# Patient Record
Sex: Female | Born: 1995 | Race: White | Hispanic: No | Marital: Single | Smoking: Never smoker
Health system: Southern US, Community
[De-identification: ages and names within clinical notes are randomized; demographics above are authoritative.]

## PROBLEM LIST (undated history)

## (undated) DIAGNOSIS — F988 Other specified behavioral and emotional disorders with onset usually occurring in childhood and adolescence: Secondary | ICD-10-CM

---

## 2014-03-12 ENCOUNTER — Encounter (INDEPENDENT_AMBULATORY_CARE_PROVIDER_SITE_OTHER): Payer: Self-pay

## 2014-03-12 ENCOUNTER — Encounter: Payer: Self-pay | Admitting: Physician Assistant

## 2014-03-12 ENCOUNTER — Ambulatory Visit (INDEPENDENT_AMBULATORY_CARE_PROVIDER_SITE_OTHER): Payer: 59 | Admitting: Physician Assistant

## 2014-03-12 VITALS — BP 106/66 | HR 68 | Temp 98.2°F | Ht 64.5 in | Wt 125.0 lb

## 2014-03-12 DIAGNOSIS — M542 Cervicalgia: Secondary | ICD-10-CM

## 2014-03-12 DIAGNOSIS — F988 Other specified behavioral and emotional disorders with onset usually occurring in childhood and adolescence: Secondary | ICD-10-CM | POA: Insufficient documentation

## 2014-03-12 MED ORDER — AMPHETAMINE-DEXTROAMPHETAMINE 10 MG PO TABS: 10.0000 mg | ORAL_TABLET | Freq: Two times a day (BID) | ORAL | Status: DC

## 2014-03-12 NOTE — Progress Notes (Signed)
Pre visit review using our clinic review tool, if applicable. No additional management support is needed unless otherwise documented below in the visit note. 
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
## 2014-03-12 NOTE — Patient Instructions (Signed)
Please take Adderall as directed on school/work days.  Follow-up in 1 month.   For neck pain, take Ibuprofen as needed with food for pain.  Apply topical Aspercreme to the area.  Your symptoms should continue to improve over the next couple of weeks.  Be careful driving.  It was a pleasure participating in your care today.  Attention Deficit Hyperactivity Disorder Attention deficit hyperactivity disorder (ADHD) is a problem with behavior issues based on the way the brain functions (neurobehavioral disorder). It is a common reason for behavior and academic problems in school. SYMPTOMS  There are 3 types of ADHD. The 3 types and some of the symptoms include:  Inattentive.  Gets bored or distracted easily.  Loses or forgets things. Forgets to hand in homework.  Has trouble organizing or completing tasks.  Difficulty staying on task.  An inability to organize daily tasks and school work.  Leaving projects, chores, or homework unfinished.  Trouble paying attention or responding to details. Careless mistakes.  Difficulty following directions. Often seems like is not listening.  Dislikes activities that require sustained attention (like chores or homework).  Hyperactive-impulsive.  Feels like it is impossible to sit still or stay in a seat. Fidgeting with hands and feet.  Trouble waiting turn.  Talking too much or out of turn. Interruptive.  Speaks or acts impulsively.  Aggressive, disruptive behavior.  Constantly busy or on the go; noisy.  Often leaves seat when they are expected to remain seated.  Often runs or climbs where it is not appropriate, or feels very restless.  Combined.  Has symptoms of both of the above. Often children with ADHD feel discouraged about themselves and with school. They often perform well below their abilities in school. As children get older, the excess motor activities can calm down, but the problems with paying attention and staying organized  persist. Most children do not outgrow ADHD but with good treatment can learn to cope with the symptoms. DIAGNOSIS  When ADHD is suspected, the diagnosis should be made by professionals trained in ADHD. This professional will collect information about the individual suspected of having ADHD. Information must be collected from various settings where the person lives, works, or attends school.  Diagnosis will include:  Confirming symptoms began in childhood.  Ruling out other reasons for the child's behavior.  The health care providers will check with the child's school and check their medical records.  They will talk to teachers and parents.  Behavior rating scales for the child will be filled out by those dealing with the child on a daily basis. A diagnosis is made only after all information has been considered. TREATMENT  Treatment usually includes behavioral treatment, tutoring or extra support in school, and stimulant medicines. Because of the way a person's brain works with ADHD, these medicines decrease impulsivity and hyperactivity and increase attention. This is different than how they would work in a person who does not have ADHD. Other medicines used include antidepressants and certain blood pressure medicines. Most experts agree that treatment for ADHD should address all aspects of the person's functioning. Along with medicines, treatment should include structured classroom management at school. Parents should reward good behavior, provide constant discipline, and set limits. Tutoring should be available for the child as needed. ADHD is a lifelong condition. If untreated, the disorder can have long-term serious effects into adolescence and adulthood. HOME CARE INSTRUCTIONS   Often with ADHD there is a lot of frustration among family members dealing with the  condition. Blame and anger are also feelings that are common. In many cases, because the problem affects the family as a whole,  the entire family may need help. A therapist can help the family find better ways to handle the disruptive behaviors of the person with ADHD and promote change. If the person with ADHD is young, most of the therapist's work is with the parents. Parents will learn techniques for coping with and improving their child's behavior. Sometimes only the child with the ADHD needs counseling. Your health care providers can help you make these decisions.  Children with ADHD may need help learning how to organize. Some helpful tips include:  Keep routines the same every day from wake-up time to bedtime. Schedule all activities, including homework and playtime. Keep the schedule in a place where the person with ADHD will often see it. Mark schedule changes as far in advance as possible.  Schedule outdoor and indoor recreation.  Have a place for everything and keep everything in its place. This includes clothing, backpacks, and school supplies.  Encourage writing down assignments and bringing home needed books. Work with your child's teachers for assistance in organizing school work.  Offer your child a well-balanced diet. Breakfast that includes a balance of whole grains, protein, and fruits or vegetables is especially important for school performance. Children should avoid drinks with caffeine including:  Soft drinks.  Coffee.  Tea.  However, some older children (adolescents) may find these drinks helpful in improving their attention. Because it can also be common for adolescents with ADHD to become addicted to caffeine, talk with your health care provider about what is a safe amount of caffeine intake for your child.  Children with ADHD need consistent rules that they can understand and follow. If rules are followed, give small rewards. Children with ADHD often receive, and expect, criticism. Look for good behavior and praise it. Set realistic goals. Give clear instructions. Look for activities that can  foster success and self-esteem. Make time for pleasant activities with your child. Give lots of affection.  Parents are their children's greatest advocates. Learn as much as possible about ADHD. This helps you become a stronger and better advocate for your child. It also helps you educate your child's teachers and instructors if they feel inadequate in these areas. Parent support groups are often helpful. A national group with local chapters is called Children and Adults with Attention Deficit Hyperactivity Disorder (CHADD). SEEK MEDICAL CARE IF:  Your child has repeated muscle twitches, cough, or speech outbursts.  Your child has sleep problems.  Your child has a marked loss of appetite.  Your child develops depression.  Your child has new or worsening behavioral problems.  Your child develops dizziness.  Your child has a racing heart.  Your child has stomach pains.  Your child develops headaches. SEEK IMMEDIATE MEDICAL CARE IF:  Your child has been diagnosed with depression or anxiety and the symptoms seem to be getting worse.  Your child has been depressed and suddenly appears to have increased energy or motivation.  You are worried that your child is having a bad reaction to a medication he or she is taking for ADHD. Document Released: 08/04/2002 Document Revised: 08/19/2013 Document Reviewed: 04/21/2013 [facility] Patient Information 2015 [place], [place]. This information is not intended to replace advice given to you by your health care provider. Make sure you discuss any questions you have with your health care provider.

## 2014-03-12 NOTE — Assessment & Plan Note (Signed)
Will begin trial of Adderall 10 mg BID.  Discussed possible ADRs with patient and mother.  Follow-up in 1 month.

## 2014-04-15 ENCOUNTER — Ambulatory Visit (INDEPENDENT_AMBULATORY_CARE_PROVIDER_SITE_OTHER): Payer: 59 | Admitting: Physician Assistant

## 2014-04-15 ENCOUNTER — Encounter: Payer: Self-pay | Admitting: Physician Assistant

## 2014-04-15 VITALS — HR 60 | Temp 98.3°F | Resp 14 | Ht 64.5 in | Wt 120.5 lb

## 2014-04-15 DIAGNOSIS — F988 Other specified behavioral and emotional disorders with onset usually occurring in childhood and adolescence: Secondary | ICD-10-CM

## 2014-04-15 MED ORDER — ATOMOXETINE HCL 25 MG PO CAPS: 25.0000 mg | ORAL_CAPSULE | Freq: Every day | ORAL | Status: DC

## 2014-04-15 NOTE — Progress Notes (Signed)
Patient presents to clinic today for follow-up of ADD since initiation of Adderall 10 mg BID.  Patient endorses significant decreased appetite, insomnia and hives with medication.  Decreased to 10 mg QD which cleared up the hives but insomnia and decreased appetite persisted.  Denies palpitations or chest pain with medication.  States the medication did help some with attention.  No past medical history on file.  No current outpatient prescriptions on file prior to visit.   No current facility-administered medications on file prior to visit.    Allergies  Allergen Reactions  . Adderall [Amphetamine-Dextroamphetamine] Hives    Also with insomnia and weight loss  . Albuterol Sulfate     Seizures     Family History  Problem Relation Age of Onset  . Cancer Maternal Grandmother 2849    breast  . Hypertension Maternal Grandfather   . Diabetes Maternal Grandfather   . Cancer Paternal Grandfather     throat cancer    History   Social History  . Marital Status: Single    Spouse Name: N/A    Number of Children: N/A  . Years of Education: N/A   Social History Main Topics  . Smoking status: Never Smoker   . Smokeless tobacco: Never Used  . Alcohol Use: No  . Drug Use: No  . Sexual Activity: None   Other Topics Concern  . None   Social History Narrative  . None   Review of Systems - See HPI.  All other ROS are negative.  Pulse 60  Temp(Src) 98.3 F (36.8 C) (Oral)  Resp 14  Ht 5' 4.5" (1.638 m)  Wt 120 lb 8 oz (54.658 kg)  BMI 20.37 kg/m2  SpO2 98%  LMP 04/08/2014  Physical Exam  Vitals reviewed. Constitutional: She is oriented to person, place, and time and well-developed, well-nourished, and in no distress.  HENT:  Head: Normocephalic and atraumatic.  Eyes: Conjunctivae are normal.  Cardiovascular: Normal rate, regular rhythm, normal heart sounds and intact distal pulses.   Pulmonary/Chest: Effort normal and breath sounds normal. No respiratory distress. She has  no wheezes. She has no rales. She exhibits no tenderness.  Neurological: She is alert and oriented to person, place, and time.  Skin: Skin is warm and dry. No rash noted.  Psychiatric: Affect normal.   Assessment/Plan: ADD (attention deficit disorder) Will d/c adderall and attempt non-stimulant medication.  Rx [employer].  Follow-up in 1 month.

## 2014-04-15 NOTE — Progress Notes (Signed)
Pre visit review using our clinic review tool, if applicable. No additional management support is needed unless otherwise documented below in the visit note/SLS  

## 2014-04-15 NOTE — Assessment & Plan Note (Signed)
Will d/c adderall and attempt non-stimulant medication.  Rx [employer].  Follow-up in 1 month.

## 2014-04-15 NOTE — Patient Instructions (Signed)
Please take medication as directed.  Follow-up in 1 month.

## 2014-05-22 ENCOUNTER — Ambulatory Visit (INDEPENDENT_AMBULATORY_CARE_PROVIDER_SITE_OTHER): Payer: 59 | Admitting: Physician Assistant

## 2014-05-22 ENCOUNTER — Ambulatory Visit: Payer: 59 | Admitting: Physician Assistant

## 2014-05-22 ENCOUNTER — Encounter: Payer: Self-pay | Admitting: Physician Assistant

## 2014-05-22 VITALS — BP 98/63 | HR 60 | Temp 99.0°F | Resp 16 | Ht 64.5 in | Wt 123.5 lb

## 2014-05-22 DIAGNOSIS — F988 Other specified behavioral and emotional disorders with onset usually occurring in childhood and adolescence: Secondary | ICD-10-CM

## 2014-05-22 MED ORDER — METHYLPHENIDATE HCL ER (OSM) 18 MG PO TBCR: 18.0000 mg | EXTENDED_RELEASE_TABLET | Freq: Every day | ORAL | Status: DC

## 2014-05-22 NOTE — Patient Instructions (Addendum)
Please take Concerta daily as directed.  Follow-up with me in 1 month.  If you notice any racing heart, insomnia or decrease in appetite, please let stop medication and let me know.

## 2014-05-22 NOTE — Progress Notes (Signed)
Pre visit review using our clinic review tool, if applicable. No additional management support is needed unless otherwise documented below in the visit note/SLS  

## 2014-05-24 NOTE — Assessment & Plan Note (Signed)
Discussed increasing Strattera versus beginning trial of Concerta.  Patient wishes to try Concerta as it worked for her in the past.  Will restart Concerta at lowest dose and titrate up to therapeutic response.  Follow-up in 1 month.

## 2014-05-24 NOTE — Progress Notes (Signed)
    Patient presents to clinic today for follow-up of ADD.  Patient currently on Strattera for symptom control.  Patient states she has noted no change in attentiveness or energy levels.  Has noticed decreased appetite and some nausea with medication. States she was previously on Concerta with good relief of symptoms when she was in her early teens.  Did not note racing heart, decreased appetite or insomnia with that medication.  No past medical history on file.  No current outpatient prescriptions on file prior to visit.   No current facility-administered medications on file prior to visit.    Allergies  Allergen Reactions  . Adderall [Amphetamine-Dextroamphetamine] Hives    Also with insomnia and weight loss  . Albuterol Sulfate     Seizures     Family History  Problem Relation Age of Onset  . Cancer Maternal Grandmother 11    breast  . Hypertension Maternal Grandfather   . Diabetes Maternal Grandfather   . Cancer Paternal Grandfather     throat cancer    History   Social History  . Marital Status: Single    Spouse Name: N/A    Number of Children: N/A  . Years of Education: N/A   Social History Main Topics  . Smoking status: Never Smoker   . Smokeless tobacco: Never Used  . Alcohol Use: No  . Drug Use: No  . Sexual Activity: None   Other Topics Concern  . None   Social History Narrative  . None   Review of Systems - See HPI.  All other ROS are negative.  BP 98/63  Pulse 60  Temp(Src) 99 F (37.2 C) (Oral)  Resp 16  Ht 5' 4.5" (1.638 m)  Wt 123 lb 8 oz (56.019 kg)  BMI 20.88 kg/m2  SpO2 100%  LMP 04/28/2014  Physical Exam  Vitals reviewed. Constitutional: She is oriented to person, place, and time and well-developed, well-nourished, and in no distress.  HENT:  Head: Normocephalic and atraumatic.  Eyes: Conjunctivae are normal.  Neck: Neck supple.  Cardiovascular: Normal rate, regular rhythm, normal heart sounds and intact distal pulses.     Pulmonary/Chest: Effort normal and breath sounds normal. No respiratory distress. She has no wheezes. She has no rales. She exhibits no tenderness.  Neurological: She is alert and oriented to person, place, and time.  Skin: Skin is warm and dry. No rash noted.  Psychiatric: Affect normal.   Assessment/Plan: ADD (attention deficit disorder) Discussed increasing Strattera versus beginning trial of Concerta.  Patient wishes to try Concerta as it worked for her in the past.  Will restart Concerta at lowest dose and titrate up to therapeutic response.  Follow-up in 1 month.

## 2014-05-25 ENCOUNTER — Telehealth: Payer: Self-pay | Admitting: Physician Assistant

## 2014-05-25 NOTE — Telephone Encounter (Signed)
Left detailed message informing patient of this. °

## 2014-05-25 NOTE — Telephone Encounter (Signed)
Please inform patient her Rx for Concerta is at the front desk.  I forgot to hand it to her at her visit Friday.  I apologize.

## 2014-06-22 ENCOUNTER — Ambulatory Visit (INDEPENDENT_AMBULATORY_CARE_PROVIDER_SITE_OTHER): Payer: 59 | Admitting: Physician Assistant

## 2014-06-22 ENCOUNTER — Other Ambulatory Visit (HOSPITAL_COMMUNITY)
Admission: RE | Admit: 2014-06-22 | Discharge: 2014-06-22 | Disposition: A | Discharge status: Home / Self Care | Payer: 59 | Source: Ambulatory Visit | Attending: Physician Assistant | Admitting: Physician Assistant

## 2014-06-22 ENCOUNTER — Encounter: Payer: Self-pay | Admitting: Physician Assistant

## 2014-06-22 VITALS — BP 112/57 | HR 65 | Temp 98.9°F | Resp 16 | Ht 64.5 in | Wt 121.5 lb

## 2014-06-22 DIAGNOSIS — R399 Unspecified symptoms and signs involving the genitourinary system: Secondary | ICD-10-CM

## 2014-06-22 DIAGNOSIS — F988 Other specified behavioral and emotional disorders with onset usually occurring in childhood and adolescence: Secondary | ICD-10-CM

## 2014-06-22 DIAGNOSIS — J069 Acute upper respiratory infection, unspecified: Secondary | ICD-10-CM

## 2014-06-22 DIAGNOSIS — F909 Attention-deficit hyperactivity disorder, unspecified type: Secondary | ICD-10-CM

## 2014-06-22 DIAGNOSIS — N760 Acute vaginitis: Secondary | ICD-10-CM | POA: Insufficient documentation

## 2014-06-22 DIAGNOSIS — R319 Hematuria, unspecified: Secondary | ICD-10-CM

## 2014-06-22 LAB — POCT URINALYSIS DIPSTICK
Nitrite, UA: NEGATIVE
Spec Grav, UA: 1.025
Urobilinogen, UA: 1
pH, UA: 6.5

## 2014-06-22 MED ORDER — FLUCONAZOLE 150 MG PO TABS: 150.0000 mg | ORAL_TABLET | Freq: Every day | ORAL | Status: DC

## 2014-06-22 MED ORDER — CIPROFLOXACIN HCL 500 MG PO TABS: 500.0000 mg | ORAL_TABLET | Freq: Two times a day (BID) | ORAL | Status: DC

## 2014-06-22 MED ORDER — METHYLPHENIDATE HCL ER (OSM) 27 MG PO TBCR: 27.0000 mg | EXTENDED_RELEASE_TABLET | Freq: Every day | ORAL | Status: DC

## 2014-06-22 NOTE — Progress Notes (Signed)
Patient presents to clinic today for follow-up of ADD.  Patient also with acute concerns.  ADD -- Patient endorses some improvement in focus with 18 mg CR Concerta.  Denies insomnia, palpitations, anorexia or other ADR. Is taking medication as directed.  Endorses symptoms still bothering her.  Patient complains of nasal congestion, ear pressure and fatigue over the past few days. Does endorse some mild bilateral ear pain.  Denies fever, chills, cough, chest congestion, sinus pressure, sinus pain or tooth pain.  Endorses family members have a "cold".  Denies recent travel.  Patient c/o urinary urgency, frequency, dysuria and suprapubic pressure x 3 days. Denies fever, N/V, or flank pain.  Endorses some vaginal irritation, itch and "yeast" infection. Is not sexually active.  No past medical history on file.  No current outpatient prescriptions on file prior to visit.   No current facility-administered medications on file prior to visit.    Allergies  Allergen Reactions  . Adderall [Amphetamine-Dextroamphetamine] Hives    Also with insomnia and weight loss  . Albuterol Sulfate     Seizures     Family History  Problem Relation Age of Onset  . Cancer Maternal Grandmother [id number]    breast  . Hypertension Maternal Grandfather   . Diabetes Maternal Grandfather   . Cancer Paternal Grandfather     throat cancer    History   Social History  . Marital Status: Single    Spouse Name: N/A    Number of Children: N/A  . Years of Education: N/A   Social History Main Topics  . Smoking status: Never Smoker   . Smokeless tobacco: Never Used  . Alcohol Use: No  . Drug Use: No  . Sexual Activity: None   Other Topics Concern  . None   Social History Narrative  . None   Review of Systems - See HPI.  All other ROS are negative.  BP 112/57  Pulse 65  Temp(Src) 98.9 F (37.2 C) (Oral)  Resp 16  Ht 5' 4.5" (1.638 m)  Wt 121 lb 8 oz (55.112 kg)  BMI 20.54 kg/m2  SpO2 100%  LMP  05/25/2014  Physical Exam  Vitals reviewed. Constitutional: She is oriented to person, place, and time and well-developed, well-nourished, and in no distress.  HENT:  Head: Normocephalic and atraumatic.  Right Ear: External ear normal.  Left Ear: External ear normal. A middle ear effusion is present.  Nose: Nose normal.  Mouth/Throat: Uvula is midline, oropharynx is clear and moist and mucous membranes are normal.  Eyes: Conjunctivae are normal.  Neck: Neck supple.  Cardiovascular: Normal rate, regular rhythm, normal heart sounds and intact distal pulses.   Pulmonary/Chest: Effort normal and breath sounds normal. No respiratory distress. She has no wheezes. She has no rales. She exhibits no tenderness.  Lymphadenopathy:    She has no cervical adenopathy.  Neurological: She is alert and oriented to person, place, and time.  Skin: Skin is warm and dry. No rash noted.  Psychiatric: Affect normal.    Recent Results (from the past 2160 hour(s))  POCT URINALYSIS DIPSTICK     Status: Abnormal   Collection Time    06/22/14 10:29 AM      Result Value Ref Range   Color, UA gold     Clarity, UA murky     Glucose, UA nef     Bilirubin, UA small     Ketones, UA trace     Spec Grav, UA 1.025  Blood, UA moderate     pH, UA 6.5     Protein, UA trace     Urobilinogen, UA 1.0     Nitrite, UA neg     Leukocytes, UA moderate (2+)      Assessment/Plan: Viral URI Some mild fluid noted behind TM. Daily Flonase and Claritin. Increase fluids. Rest. Saline nasal spray. MTV.  Return precautions discussed with patient.  Vaginitis and vulvovaginitis Suspect candida.  Rx Diflucan.  Urine sent for ancillary testing.  UTI symptoms Rx Cipro BID x 3 days.  Urine sent for culture.  Encouraged increased fluids and probiotic.  ADD (attention deficit disorder) Some improvement noted.  Tolerating medication well.  Increase Concerta to 27 mg CR tablet daily.  Follow-up in 2 months.

## 2014-06-22 NOTE — Assessment & Plan Note (Signed)
Some mild fluid noted behind TM. Daily Flonase and Claritin. Increase fluids. Rest. Saline nasal spray. MTV.  Return precautions discussed with patient.

## 2014-06-22 NOTE — Progress Notes (Signed)
Pre visit review using our clinic review tool, if applicable. No additional management support is needed unless otherwise documented below in the visit note/SLS  

## 2014-06-22 NOTE — Assessment & Plan Note (Signed)
Some improvement noted.  Tolerating medication well.  Increase Concerta to 27 mg CR tablet daily.  Follow-up in 2 months.

## 2014-06-22 NOTE — Assessment & Plan Note (Signed)
Rx Cipro BID x 3 days.  Urine sent for culture.  Encouraged increased fluids and probiotic.

## 2014-06-22 NOTE — Patient Instructions (Signed)
Please continue the Concerta daily taking the new dose.  Follow-up with me in 2 months.  Return sooner if needed.  For urinary/vaginal symptoms -- It seems you have a UTI. Please take Ciprofloxacin twice daily for 3 days.  Increase fluids.  Take a probiotic.  Take Diflucan for potential yeast infection.  I will call you with your urine results.  For the cold symptoms -- Increase fluids.  Rest.  Use Flonase (over-the-counter) as directed to help relieve fluid behind ears.  Call or return to clinic if symptoms are not improving.  Urinary Tract Infection Urinary tract infections (UTIs) can develop anywhere along your urinary tract. Your urinary tract is your body's drainage system for removing wastes and extra water. Your urinary tract includes two kidneys, two ureters, a bladder, and a urethra. Your kidneys are a pair of bean-shaped organs. Each kidney is about the size of your fist. They are located below your ribs, one on each side of your spine. CAUSES Infections are caused by microbes, which are microscopic organisms, including fungi, viruses, and bacteria. These organisms are so small that they can only be seen through a microscope. Bacteria are the microbes that most commonly cause UTIs. SYMPTOMS  Symptoms of UTIs may vary by age and gender of the patient and by the location of the infection. Symptoms in young women typically include a frequent and intense urge to urinate and a painful, burning feeling in the bladder or urethra during urination. Older women and men are more likely to be tired, shaky, and weak and have muscle aches and abdominal pain. A fever may mean the infection is in your kidneys. Other symptoms of a kidney infection include pain in your back or sides below the ribs, nausea, and vomiting. DIAGNOSIS To diagnose a UTI, your caregiver will ask you about your symptoms. Your caregiver also will ask to provide a urine sample. The urine sample will be tested for bacteria and white blood  cells. White blood cells are made by your body to help fight infection. TREATMENT  Typically, UTIs can be treated with medication. Because most UTIs are caused by a bacterial infection, they usually can be treated with the use of antibiotics. The choice of antibiotic and length of treatment depend on your symptoms and the type of bacteria causing your infection. HOME CARE INSTRUCTIONS  If you were prescribed antibiotics, take them exactly as your caregiver instructs you. Finish the medication even if you feel better after you have only taken some of the medication.  Drink enough water and fluids to keep your urine clear or pale yellow.  Avoid caffeine, tea, and carbonated beverages. They tend to irritate your bladder.  Empty your bladder often. Avoid holding urine for long periods of time.  Empty your bladder before and after sexual intercourse.  After a bowel movement, women should cleanse from front to back. Use each tissue only once. SEEK MEDICAL CARE IF:   You have back pain.  You develop a fever.  Your symptoms do not begin to resolve within 3 days. SEEK IMMEDIATE MEDICAL CARE IF:   You have severe back pain or lower abdominal pain.  You develop chills.  You have nausea or vomiting.  You have continued burning or discomfort with urination. MAKE SURE YOU:   Understand these instructions.  Will watch your condition.  Will get help right away if you are not doing well or get worse. Document Released: 05/24/2005 Document Revised: 02/13/2012 Document Reviewed: 09/22/2011 Exit[employer] Patient Information 2015 [employer]. This information is not intended to replace advice given to you by your health care provider. Make sure you discuss any questions you have with your health care provider.  

## 2014-06-22 NOTE — Assessment & Plan Note (Signed)
Suspect candida.  Rx Diflucan.  Urine sent for ancillary testing.

## 2014-06-24 LAB — URINE CYTOLOGY ANCILLARY ONLY
BACTERIAL VAGINITIS: NEGATIVE
Candida vaginitis: NEGATIVE

## 2014-06-25 LAB — CULTURE, URINE COMPREHENSIVE: Colony Count: 30000

## 2014-08-14 ENCOUNTER — Encounter: Payer: Self-pay | Admitting: Physician Assistant

## 2014-08-14 ENCOUNTER — Ambulatory Visit (INDEPENDENT_AMBULATORY_CARE_PROVIDER_SITE_OTHER): Payer: 59 | Admitting: Physician Assistant

## 2014-08-14 VITALS — BP 110/60 | HR 76 | Temp 98.1°F | Resp 16 | Wt 125.0 lb

## 2014-08-14 DIAGNOSIS — F988 Other specified behavioral and emotional disorders with onset usually occurring in childhood and adolescence: Secondary | ICD-10-CM

## 2014-08-14 DIAGNOSIS — F909 Attention-deficit hyperactivity disorder, unspecified type: Secondary | ICD-10-CM

## 2014-08-14 MED ORDER — METHYLPHENIDATE HCL ER (OSM) 36 MG PO TBCR: 36.0000 mg | EXTENDED_RELEASE_TABLET | Freq: Every day | ORAL | Status: DC

## 2014-08-14 NOTE — Patient Instructions (Signed)
Please take new dose of medication as directed.  Call and let me know how it is working for you.  If it is going well, we can refill medications.  Follow-up in 3 months.

## 2014-08-14 NOTE — Progress Notes (Signed)
Pre visit review using our clinic review tool, if applicable. No additional management support is needed unless otherwise documented below in the visit note. 

## 2014-08-14 NOTE — Assessment & Plan Note (Signed)
Increase Concerta CR to 36 mg daily. Follow-up in 2-3 months.

## 2014-08-14 NOTE — Progress Notes (Signed)
Patient presents to clinic today for follow-up of ADD after increasing Concerta CR 27 daily.  Patient endorses some improvement in symptoms but still having issues with focus.  Denies anorexia or insomnia.  Denies chest pain or palpitations.  History reviewed. No pertinent past medical history.  No current outpatient prescriptions on file prior to visit.   No current facility-administered medications on file prior to visit.    Allergies  Allergen Reactions  . Adderall [Amphetamine-Dextroamphetamine] Hives    Also with insomnia and weight loss  . Albuterol Sulfate     Seizures     Family History  Problem Relation Age of Onset  . Cancer Maternal Grandmother 2049    breast  . Hypertension Maternal Grandfather   . Diabetes Maternal Grandfather   . Cancer Paternal Grandfather     throat cancer    History   Social History  . Marital Status: Single    Spouse Name: N/A    Number of Children: N/A  . Years of Education: N/A   Social History Main Topics  . Smoking status: Never Smoker   . Smokeless tobacco: Never Used  . Alcohol Use: No  . Drug Use: No  . Sexual Activity: None   Other Topics Concern  . None   Social History Narrative    Review of Systems - See HPI.  All other ROS are negative.  BP 110/60 mmHg  Pulse 76  Temp(Src) 98.1 F (36.7 C) (Oral)  Resp 16  Wt 125 lb (56.7 kg)  SpO2 98%  Physical Exam  Constitutional: She is oriented to person, place, and time and well-developed, well-nourished, and in no distress.  HENT:  Head: Normocephalic and atraumatic.  Cardiovascular: Normal rate, regular rhythm, normal heart sounds and intact distal pulses.   Pulmonary/Chest: Effort normal and breath sounds normal. No respiratory distress. She has no wheezes. She has no rales. She exhibits no tenderness.  Neurological: She is alert and oriented to person, place, and time.  Skin: Skin is warm and dry. No rash noted.  Psychiatric: Affect normal.  Vitals  reviewed.   Recent Results (from the past 2160 hour(s))  Urine cytology ancillary only     Status: None   Collection Time: 06/22/14 12:00 AM  Result Value Ref Range   Bacterial vaginitis Negative for Bacterial Vaginitis Microorganisms     Comment: Normal Reference Range - Negative   Candida vaginitis Negative for Candida Vaginitis Microorganisms     Comment: Normal Reference Range - Negative  POCT urinalysis dipstick     Status: Abnormal   Collection Time: 06/22/14 10:29 AM  Result Value Ref Range   Color, UA gold    Clarity, UA murky    Glucose, UA nef    Bilirubin, UA small    Ketones, UA trace    Spec Grav, UA 1.025    Blood, UA moderate    pH, UA 6.5    Protein, UA trace    Urobilinogen, UA 1.0    Nitrite, UA neg    Leukocytes, UA moderate (2+)   CULTURE, URINE COMPREHENSIVE     Status: None   Collection Time: 06/22/14 12:51 PM  Result Value Ref Range   Culture ESCHERICHIA COLI    Colony Count 30,000 COLONIES/ML    Organism ID, Bacteria ESCHERICHIA COLI       Susceptibility   Escherichia coli -  (no method available)    AMPICILLIN >=32 Resistant     AMOX/CLAVULANIC 16 Intermediate     AMPICILLIN/SULBACTAM  16 Intermediate     PIP/TAZO <=4 Sensitive     IMIPENEM <=0.25 Sensitive     CEFAZOLIN <=4 Sensitive     CEFTRIAXONE <=1 Sensitive     CEFTAZIDIME <=1 Sensitive     CEFEPIME <=1 Sensitive     GENTAMICIN <=1 Sensitive     TOBRAMYCIN <=1 Sensitive     CIPROFLOXACIN <=0.25 Sensitive     LEVOFLOXACIN <=0.12 Sensitive     NITROFURANTOIN <=16 Sensitive     TRIMETH/SULFA <=20 Sensitive     Assessment/Plan: ADD (attention deficit disorder) Increase Concerta CR to 36 mg daily. Follow-up in 2-3 months.

## 2014-08-24 ENCOUNTER — Ambulatory Visit: Payer: 59 | Admitting: Physician Assistant

## 2014-08-25 ENCOUNTER — Ambulatory Visit: Payer: 59 | Admitting: Physician Assistant

## 2014-11-13 ENCOUNTER — Ambulatory Visit: Payer: 59 | Admitting: Physician Assistant

## 2014-11-16 ENCOUNTER — Encounter: Payer: Self-pay | Admitting: Physician Assistant

## 2014-11-16 ENCOUNTER — Ambulatory Visit (INDEPENDENT_AMBULATORY_CARE_PROVIDER_SITE_OTHER): Payer: Commercial Managed Care - PPO | Admitting: Physician Assistant

## 2014-11-16 VITALS — BP 103/50 | HR 70 | Temp 98.5°F | Resp 14 | Ht 64.5 in | Wt 122.4 lb

## 2014-11-16 DIAGNOSIS — F988 Other specified behavioral and emotional disorders with onset usually occurring in childhood and adolescence: Secondary | ICD-10-CM

## 2014-11-16 DIAGNOSIS — F909 Attention-deficit hyperactivity disorder, unspecified type: Secondary | ICD-10-CM

## 2014-11-16 MED ORDER — METHYLPHENIDATE HCL ER (OSM) 54 MG PO TBCR: 54.0000 mg | EXTENDED_RELEASE_TABLET | ORAL | Status: DC

## 2014-11-16 NOTE — Progress Notes (Signed)
Pre visit review using our clinic review tool, if applicable. No additional management support is needed unless otherwise documented below in the visit note/SLS  

## 2014-11-16 NOTE — Assessment & Plan Note (Signed)
Will increase Concerta to 54 mg daily.  Potential side effects discussed with patient.  Follow-up 3 months.

## 2014-11-16 NOTE — Patient Instructions (Signed)
Please take the new dose of medication as directed. Let me know how your symptoms are doing.  Follow-up with me in 3 months. Return sooner if you notice weight loss, insomnia or a racing heart as these are side effects of the Concerta.

## 2014-11-16 NOTE — Progress Notes (Signed)
   Patient presents to clinic today for 5583-month follow-up of ADD.  Patient is currently on Concerta 36 mg daily.  Endorses initial improvement in focus, but states after a few weeks symptoms are worsened.  Denies insomnia, anorexia.  No other acute concerns today.  No past medical history on file.  No current outpatient prescriptions on file prior to visit.   No current facility-administered medications on file prior to visit.    Allergies  Allergen Reactions  . Adderall [Amphetamine-Dextroamphetamine] Hives    Also with insomnia and weight loss  . Albuterol Sulfate     Seizures     Family History  Problem Relation Age of Onset  . Cancer Maternal Grandmother 949    breast  . Hypertension Maternal Grandfather   . Diabetes Maternal Grandfather   . Cancer Paternal Grandfather     throat cancer    History   Social History  . Marital Status: Single    Spouse Name: N/A  . Number of Children: N/A  . Years of Education: N/A   Social History Main Topics  . Smoking status: Never Smoker   . Smokeless tobacco: Never Used  . Alcohol Use: No  . Drug Use: No  . Sexual Activity: Not on file   Other Topics Concern  . None   Social History Narrative   Review of Systems - See HPI.  All other ROS are negative.  BP 103/50 mmHg  Pulse 70  Temp(Src) 98.5 F (36.9 C) (Oral)  Resp 14  Ht 5' 4.5" (1.638 m)  Wt 122 lb 6 oz (55.509 kg)  BMI 20.69 kg/m2  SpO2 100%  LMP 10/26/2014  Physical Exam  Constitutional: She is oriented to person, place, and time and well-developed, well-nourished, and in no distress.  HENT:  Head: Normocephalic and atraumatic.  Cardiovascular: Normal rate, regular rhythm, normal heart sounds and intact distal pulses.   Pulmonary/Chest: Effort normal and breath sounds normal. No respiratory distress. She has no wheezes. She has no rales. She exhibits no tenderness.  Neurological: She is alert and oriented to person, place, and time.  Skin: Skin is warm  and dry. No rash noted.  Psychiatric: Affect normal.  Vitals reviewed.  Assessment/Plan: ADD (attention deficit disorder) Will increase Concerta to 54 mg daily.  Potential side effects discussed with patient.  Follow-up 3 months.

## 2015-01-05 ENCOUNTER — Encounter: Payer: Self-pay | Admitting: Physician Assistant

## 2015-01-05 ENCOUNTER — Ambulatory Visit (INDEPENDENT_AMBULATORY_CARE_PROVIDER_SITE_OTHER): Payer: Commercial Managed Care - PPO | Admitting: Physician Assistant

## 2015-01-05 VITALS — BP 112/78 | HR 62 | Temp 98.0°F | Wt 118.0 lb

## 2015-01-05 DIAGNOSIS — F988 Other specified behavioral and emotional disorders with onset usually occurring in childhood and adolescence: Secondary | ICD-10-CM

## 2015-01-05 DIAGNOSIS — M67912 Unspecified disorder of synovium and tendon, left shoulder: Secondary | ICD-10-CM | POA: Insufficient documentation

## 2015-01-05 DIAGNOSIS — F909 Attention-deficit hyperactivity disorder, unspecified type: Secondary | ICD-10-CM

## 2015-01-05 MED ORDER — MELOXICAM 15 MG PO TABS: 15.0000 mg | ORAL_TABLET | Freq: Every day | ORAL | Status: DC

## 2015-01-05 NOTE — Assessment & Plan Note (Signed)
Doing well. No changes. Patient has 2 more months of Rx. Will obtain UDS at follow-up.

## 2015-01-05 NOTE — Progress Notes (Signed)
   Patient presents to clinic today c/o left shoulder pain and stiffness x 3 days.  Notes heavy lifting at work Sunday but denies trauma or injury.  Symptoms started early next morning.  Endorses pain with ROM but denies decreased ROM, swelling, numbness, tingling or bruising.  Has not taken anything for her symptoms.  Patient also following up for ADD.  Endorses good relief of symptoms and good focus with current dose of Concerta. Denies insomnia, anorexia or palpitations. School has been going much better.   No past medical history on file.  Current Outpatient Prescriptions on File Prior to Visit  Medication Sig Dispense Refill  . methylphenidate (CONCERTA) 54 MG PO CR tablet Take 1 tablet (54 mg total) by mouth every morning. 30 tablet 0   No current facility-administered medications on file prior to visit.    Allergies  Allergen Reactions  . Adderall [Amphetamine-Dextroamphetamine] Hives    Also with insomnia and weight loss  . Albuterol Sulfate     Seizures     Family History  Problem Relation Age of Onset  . Cancer Maternal Grandmother 5649    breast  . Hypertension Maternal Grandfather   . Diabetes Maternal Grandfather   . Cancer Paternal Grandfather     throat cancer    History   Social History  . Marital Status: Single    Spouse Name: N/A  . Number of Children: N/A  . Years of Education: N/A   Social History Main Topics  . Smoking status: Never Smoker   . Smokeless tobacco: Never Used  . Alcohol Use: No  . Drug Use: No  . Sexual Activity: Not on file   Other Topics Concern  . None   Social History Narrative   Review of Systems - See HPI.  All other ROS are negative.  BP 112/78 mmHg  Pulse 62  Temp(Src) 98 F (36.7 C)  Wt 118 lb (53.524 kg)  SpO2 100%  Physical Exam  Constitutional: She is oriented to person, place, and time and well-developed, well-nourished, and in no distress.  HENT:  Head: Normocephalic.  Cardiovascular: Normal rate, regular  rhythm, normal heart sounds and intact distal pulses.   Pulmonary/Chest: Effort normal and breath sounds normal. No respiratory distress. She has no wheezes. She has no rales. She exhibits no tenderness.  Musculoskeletal:       Left shoulder: She exhibits tenderness and pain. She exhibits normal range of motion, no bony tenderness, no swelling, no spasm, normal pulse and normal strength.  Neurological: She is alert and oriented to person, place, and time.  Skin: Skin is warm and dry. No rash noted.  Psychiatric: Affect normal.  Vitals reviewed.   No results found for this or any previous visit (from the past 2160 hour(s)).  Assessment/Plan: Tendinopathy of left rotator cuff Rx Mobic daily with food.  ES Tylenol for breakthrough pain if needed. Avoid heavy lifting or overexertion. Topical Aspercreme. Alternate ice and heat. Follow-up if not resolving over next week.   ADD (attention deficit disorder) Doing well. No changes. Patient has 2 more months of Rx. Will obtain UDS at follow-up.

## 2015-01-05 NOTE — Patient Instructions (Signed)
Please take Mobic daily as directed with food. Use Tylenol if needed for breakthrough pain later in the day. Apply topical Aspercreme to the area. Limit heavy lifting. Symptoms will improve over the next week.  For ADD -- please restart your medication as directed.  Let me know if there are any issues with dose.  Follow-up in 6 months.

## 2015-01-05 NOTE — Assessment & Plan Note (Signed)
Rx Mobic daily with food.  ES Tylenol for breakthrough pain if needed. Avoid heavy lifting or overexertion. Topical Aspercreme. Alternate ice and heat. Follow-up if not resolving over next week.

## 2015-01-13 ENCOUNTER — Encounter: Payer: Self-pay | Admitting: Physician Assistant

## 2015-01-13 ENCOUNTER — Ambulatory Visit (INDEPENDENT_AMBULATORY_CARE_PROVIDER_SITE_OTHER): Payer: Commercial Managed Care - PPO | Admitting: Physician Assistant

## 2015-01-13 VITALS — BP 114/65 | HR 72 | Temp 98.7°F | Ht 65.0 in | Wt 123.6 lb

## 2015-01-13 DIAGNOSIS — F988 Other specified behavioral and emotional disorders with onset usually occurring in childhood and adolescence: Secondary | ICD-10-CM

## 2015-01-13 DIAGNOSIS — F909 Attention-deficit hyperactivity disorder, unspecified type: Secondary | ICD-10-CM | POA: Diagnosis not present

## 2015-01-13 DIAGNOSIS — R21 Rash and other nonspecific skin eruption: Secondary | ICD-10-CM

## 2015-01-13 MED ORDER — DEXMETHYLPHENIDATE HCL ER 40 MG PO CP24: 40.0000 mg | ORAL_CAPSULE | Freq: Every day | ORAL | Status: DC

## 2015-01-13 MED ORDER — CLOBETASOL PROPIONATE 0.05 % EX OINT: 1.0000 "application " | TOPICAL_OINTMENT | Freq: Two times a day (BID) | CUTANEOUS | Status: DC

## 2015-01-13 MED ORDER — METHYLPREDNISOLONE 4 MG PO TBPK: ORAL_TABLET | ORAL | Status: DC

## 2015-01-13 NOTE — Assessment & Plan Note (Signed)
Stop new soaps/lotions. Keep area clean and dry.  Will begin Medrol dose pack.  Rx Clobetasol BID to area. Supportive measures discussed.  Follow-up if not resolving.

## 2015-01-13 NOTE — Patient Instructions (Signed)
Please take steroid pack as directed.  Apply the clobetasol ointment to your back as directed.  Cool compresses and benadryl will also be beneficial.  Stop new soaps and go back to old regimen.  Stop the Concerta and begin new medication as directed.  If symptoms are not improving, we will need a Psychiatrist to give input.

## 2015-01-13 NOTE — Progress Notes (Signed)
    Patient presents to clinic today c/o itchy rash of upper back x 1 day.  Denies scratch or injury. Denies drainage from site.  Denies fever, chills.  Denies change to lotions or detergents.  Started a new soap this past week.  Denies rash elsewhere.  Patient also wishing to discuss medication management for ADD as she is not responding to Concerta even though multiple doses have been attempted.  No past medical history on file.  No current outpatient prescriptions on file prior to visit.   No current facility-administered medications on file prior to visit.    Allergies  Allergen Reactions  . Adderall [Amphetamine-Dextroamphetamine] Hives    Also with insomnia and weight loss  . Albuterol Sulfate     Seizures     Family History  Problem Relation Age of Onset  . Cancer Maternal Grandmother 9749    breast  . Hypertension Maternal Grandfather   . Diabetes Maternal Grandfather   . Cancer Paternal Grandfather     throat cancer    History   Social History  . Marital Status: Single    Spouse Name: N/A  . Number of Children: N/A  . Years of Education: N/A   Social History Main Topics  . Smoking status: Never Smoker   . Smokeless tobacco: Never Used  . Alcohol Use: No  . Drug Use: No  . Sexual Activity: Not on file   Other Topics Concern  . None   Social History Narrative   Review of Systems - See HPI.  All other ROS are negative.  BP 114/65 mmHg  Pulse 72  Temp(Src) 98.7 F (37.1 C) (Oral)  Ht 5\' 5"  (1.651 m)  Wt 123 lb 9.6 oz (56.065 kg)  BMI 20.57 kg/m2  SpO2 100%  LMP 01/13/2015  Physical Exam  Constitutional: She is oriented to person, place, and time and well-developed, well-nourished, and in no distress.  HENT:  Head: Normocephalic and atraumatic.  Eyes: Conjunctivae are normal.  Cardiovascular: Normal rate, regular rhythm, normal heart sounds and intact distal pulses.   Pulmonary/Chest: Effort normal and breath sounds normal. No respiratory  distress. She has no wheezes. She has no rales. She exhibits no tenderness.  Neurological: She is alert and oriented to person, place, and time.  Skin: Skin is warm and dry.     Psychiatric: Affect normal.  Vitals reviewed.  Assessment/Plan: ADD (attention deficit disorder) Not responding to multiple regimens.  Will make last attempt with Dexmethylphenidate.  If not responding, will refer back to Psychiatry for repeat evaluation.   Rash and nonspecific skin eruption Stop new soaps/lotions. Keep area clean and dry.  Will begin Medrol dose pack.  Rx Clobetasol BID to area. Supportive measures discussed.  Follow-up if not resolving.

## 2015-01-13 NOTE — Assessment & Plan Note (Signed)
Not responding to multiple regimens.  Will make last attempt with Dexmethylphenidate.  If not responding, will refer back to Psychiatry for repeat evaluation.

## 2015-01-13 NOTE — Progress Notes (Signed)
Pre visit review using our clinic review tool, if applicable. No additional management support is needed unless otherwise documented below in the visit note. 

## 2015-02-16 ENCOUNTER — Ambulatory Visit: Payer: Commercial Managed Care - PPO | Admitting: Physician Assistant

## 2015-02-22 ENCOUNTER — Telehealth: Payer: Self-pay | Admitting: Physician Assistant

## 2015-02-22 MED ORDER — DEXMETHYLPHENIDATE HCL ER 40 MG PO CP24: 40.0000 mg | ORAL_CAPSULE | Freq: Every day | ORAL | Status: DC

## 2015-02-22 NOTE — Telephone Encounter (Signed)
Prescription at front desk for pickup.  Have her follow-up in 3 months. Please and Thank You.

## 2015-02-22 NOTE — Telephone Encounter (Signed)
Caller name: Shanin Relationship to patient:self  Can be reached:[phone] Pharmacy: [facility]  Reason for call: she wants Selena BattenCody to know that the dexmethylphenidate is working  She needs a refill

## 2015-02-22 NOTE — Telephone Encounter (Signed)
Called and spoke with the pt and informed her of the note below.  Pt verbalized understanding and agreed.//AB/CMA 

## 2015-02-23 ENCOUNTER — Ambulatory Visit: Payer: Commercial Managed Care - PPO | Admitting: Physician Assistant

## 2015-05-25 ENCOUNTER — Ambulatory Visit (INDEPENDENT_AMBULATORY_CARE_PROVIDER_SITE_OTHER): Payer: Commercial Managed Care - PPO | Admitting: Physician Assistant

## 2015-05-25 ENCOUNTER — Encounter: Payer: Self-pay | Admitting: Physician Assistant

## 2015-05-25 VITALS — BP 103/60 | HR 65 | Temp 98.3°F | Resp 16 | Ht 65.0 in | Wt 119.1 lb

## 2015-05-25 DIAGNOSIS — F988 Other specified behavioral and emotional disorders with onset usually occurring in childhood and adolescence: Secondary | ICD-10-CM

## 2015-05-25 DIAGNOSIS — F909 Attention-deficit hyperactivity disorder, unspecified type: Secondary | ICD-10-CM

## 2015-05-25 DIAGNOSIS — L700 Acne vulgaris: Secondary | ICD-10-CM | POA: Diagnosis not present

## 2015-05-25 MED ORDER — DEXMETHYLPHENIDATE HCL ER 40 MG PO CP24: 40.0000 mg | ORAL_CAPSULE | Freq: Every day | ORAL | Status: DC

## 2015-05-25 MED ORDER — ERYTHROMYCIN 2 % EX GEL: Freq: Every day | CUTANEOUS | Status: DC

## 2015-05-25 NOTE — Assessment & Plan Note (Signed)
Increase hydration. Continue benzoyl peroxide washes. Rx Erythromycin 2% gel to apply daily. Follow-up 1 month.

## 2015-05-25 NOTE — Patient Instructions (Signed)
Please continue the ADD medication as directed. I am glad it is doing well for you.  As long as things are going well, we will follow-up every 6 months.  For the acne, please continue your OTC acne washes. Use the Erythromycin gel as directed. Let me know how this is working for you over the next month.

## 2015-05-25 NOTE — Assessment & Plan Note (Signed)
Doing well. Will continue current regimen. Medication refilled. Follow-up 6 months.

## 2015-05-25 NOTE — Progress Notes (Signed)
    Patient presents to clinic today for follow-up of ADD. Is taking medications as directed with marked improvement in her symptoms. School is going well per patient. Denies insomnia, anorexia, palpitations.  Patient also wanting to discuss treatment options for acne. Has increased water and is using benzoyl peroxide washes at home without marked improvement in symptoms. Has never been on Rx medication for this.  No past medical history on file.  Current Outpatient Prescriptions on File Prior to Visit  Medication Sig Dispense Refill  . clobetasol ointment (TEMOVATE) 0.05 % Apply 1 application topically 2 (two) times daily. 30 g 0   No current facility-administered medications on file prior to visit.    Allergies  Allergen Reactions  . Adderall [Amphetamine-Dextroamphetamine] Hives    Also with insomnia and weight loss  . Albuterol Sulfate     Seizures     Family History  Problem Relation Age of Onset  . Cancer Maternal Grandmother 76    breast  . Hypertension Maternal Grandfather   . Diabetes Maternal Grandfather   . Cancer Paternal Grandfather     throat cancer    Social History   Social History  . Marital Status: Single    Spouse Name: N/A  . Number of Children: N/A  . Years of Education: N/A   Social History Main Topics  . Smoking status: Never Smoker   . Smokeless tobacco: Never Used  . Alcohol Use: No  . Drug Use: No  . Sexual Activity: Not Asked   Other Topics Concern  . None   Social History Narrative   Review of Systems - See HPI.  All other ROS are negative.  BP 103/60 mmHg  Pulse 65  Temp(Src) 98.3 F (36.8 C) (Oral)  Resp 16  Ht  (1.651 m)  Wt 119 lb 2 oz (54.035 kg)  BMI 19.82 kg/m2  SpO2 100%  LMP 05/13/2015  Physical Exam  Constitutional: She is oriented to person, place, and time and well-developed, well-nourished, and in no distress.  HENT:  Head: Normocephalic and atraumatic.  Eyes: Conjunctivae are normal.    Cardiovascular: Normal rate, regular rhythm, normal heart sounds and intact distal pulses.   Pulmonary/Chest: Effort normal and breath sounds normal. No respiratory distress. She has no wheezes. She has no rales. She exhibits no tenderness.  Abdominal: Soft. Bowel sounds are normal.  Neurological: She is alert and oriented to person, place, and time.  Skin:  acne vulgaris noted of face including forehead and buccal region.  Vitals reviewed.   No results found for this or any previous visit (from the past 2160 hour(s)).  Assessment/Plan: ADD (attention deficit disorder) Doing well. Will continue current regimen. Medication refilled. Follow-up 6 months.  Acne vulgaris Increase hydration. Continue benzoyl peroxide washes. Rx Erythromycin 2% gel to apply daily. Follow-up 1 month.

## 2015-05-25 NOTE — Progress Notes (Signed)
Pre visit review using our clinic review tool, if applicable. No additional management support is needed unless otherwise documented below in the visit note/SLS  

## 2015-06-14 ENCOUNTER — Telehealth: Payer: Self-pay | Admitting: Physician Assistant

## 2015-06-14 NOTE — Telephone Encounter (Signed)
FYI: Spoke with patient RE: current medical issues reported; pt states that she is having a lot of Anxiety "about a lot of different things right now"; scheduled appointment per provider for Tues, 06/15/2015 at 7:15am/[facility]

## 2015-06-14 NOTE — Telephone Encounter (Signed)
Caller name:Dante Mijangos Relationship to patient:self Can be [phone] Pharmacy:  Reason for call: Having numbness in hands and feet. Is this due to dexmethylphenidate?

## 2015-06-14 NOTE — Telephone Encounter (Signed)
She has been on that medication for a while so that is unlikely. Can be anxiety -- causes constriction of peripheral blood vessels (hands and feet) which can cause tingling. Is she feeling anxious? If concerned about medication side effect, stop medication and we can discuss in office. Also recommend OV tomorrow if symptoms have not resolved.

## 2015-06-15 ENCOUNTER — Ambulatory Visit (INDEPENDENT_AMBULATORY_CARE_PROVIDER_SITE_OTHER): Payer: Commercial Managed Care - PPO | Admitting: Physician Assistant

## 2015-06-15 ENCOUNTER — Encounter: Payer: Self-pay | Admitting: Physician Assistant

## 2015-06-15 VITALS — BP 122/75 | HR 95 | Temp 98.0°F | Resp 16 | Ht 65.0 in | Wt 116.2 lb

## 2015-06-15 DIAGNOSIS — F430 Acute stress reaction: Secondary | ICD-10-CM | POA: Diagnosis not present

## 2015-06-15 NOTE — Progress Notes (Signed)
Pre visit review using our clinic review tool, if applicable. No additional management support is needed unless otherwise documented below in the visit note/SLS  

## 2015-06-15 NOTE — Progress Notes (Signed)
    Patient presents to clinic today c/o anxiety stemming from multiple stressors at work, school and at home. States that she feels "overloaded" and overwhelmed. Endorses working 40+ hours per week while going to school full time. Is making her stressed and "fidgety". Denies panic attack, SI/HI. Is trying to cut back hours at work.    No past medical history on file.  Current Outpatient Prescriptions on File Prior to Visit  Medication Sig Dispense Refill  . clobetasol ointment (TEMOVATE) 0.05 % Apply 1 application topically 2 (two) times daily. 30 g 0  . Dexmethylphenidate HCl 40 MG CP24 Take 1 capsule (40 mg total) by mouth daily. 30 capsule 0  . erythromycin with ethanol (EMGEL) 2 % gel Apply topically daily. 30 g 0  . ibuprofen (ADVIL,MOTRIN) 200 MG tablet Take 200 mg by mouth every 6 (six) hours as needed.     No current facility-administered medications on file prior to visit.    Allergies  Allergen Reactions  . Adderall [Amphetamine-Dextroamphetamine] Hives    Also with insomnia and weight loss  . Albuterol Sulfate     Seizures     Family History  Problem Relation Age of Onset  . Cancer Maternal Grandmother 249    breast  . Hypertension Maternal Grandfather   . Diabetes Maternal Grandfather   . Cancer Paternal Grandfather     throat cancer    Social History   Social History  . Marital Status: Single    Spouse Name: N/A  . Number of Children: N/A  . Years of Education: N/A   Social History Main Topics  . Smoking status: Never Smoker   . Smokeless tobacco: Never Used  . Alcohol Use: No  . Drug Use: No  . Sexual Activity: Not Asked   Other Topics Concern  . None   Social History Narrative   Review of Systems - See HPI.  All other ROS are negative.  BP 122/75 mmHg  Pulse 95  Temp(Src) 98 F (36.7 C) (Oral)  Resp 16  Ht 5\' 5"  (1.651 m)  Wt 116 lb 4 oz (52.731 kg)  BMI 19.35 kg/m2  SpO2 100%  LMP 06/12/2015  Physical Exam  Constitutional: She is  oriented to person, place, and time and well-developed, well-nourished, and in no distress.  HENT:  Head: Normocephalic and atraumatic.  Eyes: Conjunctivae are normal.  Neck: Neck supple.  Cardiovascular: Normal rate, regular rhythm, normal heart sounds and intact distal pulses.   Pulmonary/Chest: No respiratory distress. She has no wheezes. She has no rales. She exhibits no tenderness.  Neurological: She is alert and oriented to person, place, and time.  Skin: Skin is warm and dry. No rash noted.  Psychiatric: Affect normal.  Vitals reviewed.   No results found for this or any previous visit (from the past 2160 hour(s)).  Assessment/Plan: Acute stress reaction Due to multiple stressors. Anxiety is mainly situational. Discussed relaxation techniques. Discussed medication versus CBT versus decreased work hours. Patient will try to cut down work hours from 40+ to 32 as she feels her boss will allow this. Encouraged patient that if this does not improve anxiety, she needs to give us a call so we can start medication for this.

## 2015-06-15 NOTE — Assessment & Plan Note (Signed)
Due to multiple stressors. Anxiety is mainly situational. Discussed relaxation techniques. Discussed medication versus CBT versus decreased work hours. Patient will try to cut down work hours from 40+ to 32 as she feels her boss will allow this. Encouraged patient that if this does not improve anxiety, she needs to give us a call so we can start medication for this.

## 2015-06-15 NOTE — Patient Instructions (Signed)
Try to see if your manage will cut your hours down some more as you are overwhelmed with your current school and work hours. This will help with stress.   Try to spend at least 15 minutes a day doing something just for yourself to help de-stress.  Give some thought to medication for anxiety if things are not improving. Never hesitate to call me at [phone] or [phone] (cell phone for weekend emergencies)

## 2015-07-09 ENCOUNTER — Ambulatory Visit: Payer: Commercial Managed Care - PPO | Admitting: Physician Assistant

## 2015-07-09 ENCOUNTER — Telehealth: Payer: Self-pay | Admitting: Physician Assistant

## 2015-07-14 NOTE — Telephone Encounter (Signed)
No charge. Saw her last month for acute and follow-up was completed at same time. I am betting she just forgot to cancel the other appointment.

## 2015-07-14 NOTE — Telephone Encounter (Signed)
fwding to correct person

## 2015-07-14 NOTE — Telephone Encounter (Signed)
Pt was no show 07/09/15 9:15am for follow up appt, pt has not rescheduled, charge or no charge?

## 2015-08-10 ENCOUNTER — Telehealth: Payer: Self-pay | Admitting: Physician Assistant

## 2015-08-10 NOTE — Telephone Encounter (Signed)
LM for pt to call and schedule flu shot or update records. °

## 2015-08-24 ENCOUNTER — Telehealth: Payer: Self-pay | Admitting: Physician Assistant

## 2015-08-24 MED ORDER — DEXMETHYLPHENIDATE HCL ER 40 MG PO CP24: 40.0000 mg | ORAL_CAPSULE | Freq: Every day | ORAL | Status: DC

## 2015-08-24 NOTE — Addendum Note (Signed)
Addended by: Verdie ShireBAYNES, Shayde Gervacio M on: 08/24/2015 01:48 PM   Modules accepted: Orders

## 2015-08-24 NOTE — Telephone Encounter (Signed)
Requesting Dexmethylphenidate HCI 40mg -Take 1 capsule by mouth daily. Last refill:05/25/15;#30,0-(Can fill on or after 07/22/15). Last OV:06/15/15 No UDS Please advise.//AB/CMA

## 2015-08-24 NOTE — Telephone Encounter (Signed)
Ok for #30 

## 2015-08-24 NOTE — Telephone Encounter (Signed)
Relation to pt: self Call back number:[phone] Pharmacy: [facility]  [address], [place], [place] [address] [phone]   Reason for call:   Patient requesting a refill Dexmethylphenidate HCl 40 MG CP24

## 2015-08-25 NOTE — Telephone Encounter (Signed)
[facility] @ 8[facility]46[facility] @ [phone]) informing the pt that the prescription she requested has been approved and printed and placed up front for her to pick up.//AB/CMA

## 2015-10-12 ENCOUNTER — Telehealth: Payer: Self-pay | Admitting: Physician Assistant

## 2015-10-12 MED ORDER — DEXMETHYLPHENIDATE HCL ER 40 MG PO CP24: 40.0000 mg | ORAL_CAPSULE | Freq: Every day | ORAL | Status: DC

## 2015-10-12 NOTE — Telephone Encounter (Signed)
Relation to WU:JWJX Call back number:[phone] [facility]acy[facility] [address] [place], [place], [place] [address] [phone]    Reason for call:  Patient requesting a refill Dexmethylphenidate HCl 40 MG CP24

## 2015-10-12 NOTE — Telephone Encounter (Signed)
Last OV 06/15/15 (acute stress reaction) Dexmethylphenidate last filled 08/24/15 #30 with 0

## 2015-10-12 NOTE — Telephone Encounter (Signed)
Called pt and informed that rx is available at the front desk for pick up.

## 2015-10-12 NOTE — Telephone Encounter (Signed)
Rx ready for pick up. 

## 2015-11-22 ENCOUNTER — Ambulatory Visit: Payer: Commercial Managed Care - PPO | Admitting: Physician Assistant

## 2015-11-23 ENCOUNTER — Telehealth: Payer: Self-pay | Admitting: Physician Assistant

## 2015-11-23 NOTE — Telephone Encounter (Signed)
Requesting Dexmethylphenidate HCL 40mg -Take 1 capsule by mouth daily. Last refill:10/12/15;#30,0 Last OV:06/15/15 No UDS Please advise.//AB/CMA

## 2015-11-23 NOTE — Telephone Encounter (Signed)
Pt called for refill on Dexmethylphenidate HCl 40 MG CP24. She is out of medication. Advised that Selena BattenCody is out of office and RX will not be ready until tomorrow 3/29. Pt said that was fine. Please call when RX is ready for pick up at [phone].

## 2015-11-24 MED ORDER — DEXMETHYLPHENIDATE HCL ER 40 MG PO CP24: 40.0000 mg | ORAL_CAPSULE | Freq: Every day | ORAL | Status: DC

## 2015-11-24 NOTE — Telephone Encounter (Signed)
[facility] 2:[facility] @ [phone]) informing the pt the her refill request has been approved and prescription will be left up front for her to pick up.  Also informed the pt that she is due for a follow-up in April and at that time we will get a UDS.//AB/CMA

## 2015-11-24 NOTE — Telephone Encounter (Signed)
1 month supply given. She is due for follow-up in April. Will get UDS at her follow-up

## 2015-11-26 ENCOUNTER — Encounter: Payer: Self-pay | Admitting: Physician Assistant

## 2015-11-26 ENCOUNTER — Ambulatory Visit (INDEPENDENT_AMBULATORY_CARE_PROVIDER_SITE_OTHER): Payer: Commercial Managed Care - PPO | Admitting: Physician Assistant

## 2015-11-26 VITALS — BP 108/78 | HR 86 | Temp 98.3°F | Ht 65.0 in | Wt 121.4 lb

## 2015-11-26 DIAGNOSIS — F909 Attention-deficit hyperactivity disorder, unspecified type: Secondary | ICD-10-CM

## 2015-11-26 DIAGNOSIS — F988 Other specified behavioral and emotional disorders with onset usually occurring in childhood and adolescence: Secondary | ICD-10-CM

## 2015-11-26 MED ORDER — DEXMETHYLPHENIDATE HCL ER 40 MG PO CP24: 40.0000 mg | ORAL_CAPSULE | Freq: Every day | ORAL | Status: DC

## 2015-11-26 NOTE — Assessment & Plan Note (Signed)
Doing very well with current regimen. Vitals stable. Weight improved. Will continue current medications. Refills given. CSC signed. UDS obtained.

## 2015-11-26 NOTE — Progress Notes (Signed)
Pre visit review using our clinic review tool, if applicable. No additional management support is needed unless otherwise documented below in the visit note. 

## 2015-11-26 NOTE — Patient Instructions (Signed)
Please continue medications as directed. I am glad you are doing so well. Follow-up in 6 months. Return sooner if needed.

## 2015-11-26 NOTE — Progress Notes (Signed)
   Patient presents to clinic today for 6 month follow-up of ADD. Is taking Dexmethylphenidate 40 mg as directed. Only takes on weekdays. Endorses doing well at school and work. Denies side effect of medications. Is eating well. Body mass index is 20.2 kg/(m^2).  No past medical history on file.  Current Outpatient Prescriptions on File Prior to Visit  Medication Sig Dispense Refill  . clobetasol ointment (TEMOVATE) 0.05 % Apply 1 application topically 2 (two) times daily. 30 g 0  . Dexmethylphenidate HCl 40 MG CP24 Take 1 capsule (40 mg total) by mouth daily. 30 capsule 0  . erythromycin with ethanol (EMGEL) 2 % gel Apply topically daily. 30 g 0  . ibuprofen (ADVIL,MOTRIN) 200 MG tablet Take 200 mg by mouth every 6 (six) hours as needed.     No current facility-administered medications on file prior to visit.    Allergies  Allergen Reactions  . Adderall [Amphetamine-Dextroamphetamine] Hives    Also with insomnia and weight loss  . Albuterol Sulfate     Seizures     Family History  Problem Relation Age of Onset  . Cancer Maternal Grandmother 1649    breast  . Hypertension Maternal Grandfather   . Diabetes Maternal Grandfather   . Cancer Paternal Grandfather     throat cancer    Social History   Social History  . Marital Status: Single    Spouse Name: N/A  . Number of Children: N/A  . Years of Education: N/A   Social History Main Topics  . Smoking status: Never Smoker   . Smokeless tobacco: Never Used  . Alcohol Use: No  . Drug Use: No  . Sexual Activity: Not Asked   Other Topics Concern  . None   Social History Narrative   Review of Systems - See HPI.  All other ROS are negative.  BP 108/78 mmHg  Pulse 86  Temp(Src) 98.3 F (36.8 C) (Oral)  Ht 5\' 5"  (1.651 m)  Wt 121 lb 6.4 oz (55.067 kg)  BMI 20.20 kg/m2  SpO2 98%  LMP 11/25/2015  Physical Exam  Constitutional: She is well-developed, well-nourished, and in no distress.  HENT:  Head: Normocephalic  and atraumatic.  Cardiovascular: Normal rate, regular rhythm, normal heart sounds and intact distal pulses.   Vitals reviewed.   No results found for this or any previous visit (from the past 2160 hour(s)).  Assessment/Plan: ADD (attention deficit disorder) Doing very well with current regimen. Vitals stable. Weight improved. Will continue current medications. Refills given. CSC signed. UDS obtained.

## 2016-02-23 ENCOUNTER — Encounter: Payer: Self-pay | Admitting: Physician Assistant

## 2016-03-06 ENCOUNTER — Encounter: Payer: Self-pay | Admitting: Physician Assistant

## 2016-03-06 ENCOUNTER — Ambulatory Visit (INDEPENDENT_AMBULATORY_CARE_PROVIDER_SITE_OTHER): Payer: Commercial Managed Care - PPO | Admitting: Physician Assistant

## 2016-03-06 VITALS — BP 104/74 | HR 84 | Temp 98.8°F | Resp 16 | Ht 65.0 in | Wt 124.0 lb

## 2016-03-06 DIAGNOSIS — R300 Dysuria: Secondary | ICD-10-CM

## 2016-03-06 DIAGNOSIS — K5900 Constipation, unspecified: Secondary | ICD-10-CM

## 2016-03-06 LAB — POCT URINALYSIS DIPSTICK
BILIRUBIN UA: NEGATIVE
GLUCOSE UA: NEGATIVE
KETONES UA: NEGATIVE
NITRITE UA: NEGATIVE
Protein, UA: NEGATIVE
Spec Grav, UA: 1.025
Urobilinogen, UA: 0.2
pH, UA: 7

## 2016-03-06 MED ORDER — CIPROFLOXACIN HCL 500 MG PO TABS: 500.0000 mg | ORAL_TABLET | Freq: Two times a day (BID) | ORAL | Status: DC

## 2016-03-06 NOTE — Patient Instructions (Signed)
Your symptoms are consistent with a bladder infection, also called acute cystitis. Please take your antibiotic (Cipro) as directed until all pills are gone.  Stay very well hydrated.  Consider a daily probiotic (Align, Culturelle, or Activia) to help prevent stomach upset caused by the antibiotic.  Taking a probiotic daily may also help prevent recurrent UTIs.  Also consider taking AZO (Phenazopyridine) tablets to help decrease pain with urination.  I will call you with your urine testing results.  We will change antibiotics if indicated.  Call or return to clinic if symptoms are not resolved by completion of antibiotic.   I encourage you to increase hydration and the amount of fiber in your diet.  Start a daily probiotic (Align, Culturelle, Digestive Advantage, etc.). If no bowel movement within 24 hours, take 2 Tbs of Milk of Magnesia in a 4 oz glass of warmed prune juice every 2-3 days to help promote bowel movement. If no results within 24 hours, then repeat above regimen, adding a Dulcolax stool softener to regimen. If this does not promote a bowel movement, please call the office.   Urinary Tract Infection A urinary tract infection (UTI) can occur any place along the urinary tract. The tract includes the kidneys, ureters, bladder, and urethra. A type of germ called bacteria often causes a UTI. UTIs are often helped with antibiotic medicine.  HOME CARE   If given, take antibiotics as told by your doctor. Finish them even if you start to feel better.  Drink enough fluids to keep your pee (urine) clear or pale yellow.  Avoid tea, drinks with caffeine, and bubbly (carbonated) drinks.  Pee often. Avoid holding your pee in for a long time.  Pee before and after having sex (intercourse).  Wipe from front to back after you poop (bowel movement) if you are a woman. Use each tissue only once. GET HELP RIGHT AWAY IF:   You have back pain.  You have lower belly (abdominal) pain.  You have  chills.  You feel sick to your stomach (nauseous).  You throw up (vomit).  Your burning or discomfort with peeing does not go away.  You have a fever.  Your symptoms are not better in 3 days. MAKE SURE YOU:   Understand these instructions.  Will watch your condition.  Will get help right away if you are not doing well or get worse. Document Released: 01/31/2008 Document Revised: 05/08/2012 Document Reviewed: 03/14/2012 [facility] Patient Information 2015 [place] [place]. This information is not intended to replace advice given to you by your health care provider. Make sure you discuss any questions you have with your health care provider.

## 2016-03-06 NOTE — Addendum Note (Signed)
Addended by: Regis BillSCATES, Juliauna Stueve L on: 03/06/2016 05:02 PM   Modules accepted: Kipp BroodSmartSet

## 2016-03-06 NOTE — Progress Notes (Signed)
    Patient presents to clinic today c/o 1 week of urinary urgency, frequency and dysuria. Endorses urgency and dysuria has mostly resolved but she noted some R-sided low back pain. Denies fever, chills, nausea or vomiting. Denies change to appetite. Is still having significant constipation. On average, is having a BM about once every 2-3 days. Denies tenesmus or rectal bleeding. Does note pain when she is really constipated.  No past medical history on file.  Current Outpatient Prescriptions on File Prior to Visit  Medication Sig Dispense Refill  . clobetasol ointment (TEMOVATE) 0.05 % Apply 1 application topically 2 (two) times daily. 30 g 0  . Dexmethylphenidate HCl 40 MG CP24 Take 1 capsule (40 mg total) by mouth daily. 30 capsule 0  . erythromycin with ethanol (EMGEL) 2 % gel Apply topically daily. 30 g 0  . ibuprofen (ADVIL,MOTRIN) 200 MG tablet Take 200 mg by mouth every 6 (six) hours as needed.     No current facility-administered medications on file prior to visit.    Allergies  Allergen Reactions  . Adderall [Amphetamine-Dextroamphetamine] Hives    Also with insomnia and weight loss  . Albuterol Sulfate     Seizures     Family History  Problem Relation Age of Onset  . Cancer Maternal Grandmother 6849    breast  . Hypertension Maternal Grandfather   . Diabetes Maternal Grandfather   . Cancer Paternal Grandfather     throat cancer    Social History   Social History  . Marital Status: Single    Spouse Name: N/A  . Number of Children: N/A  . Years of Education: N/A   Social History Main Topics  . Smoking status: Never Smoker   . Smokeless tobacco: Never Used  . Alcohol Use: No  . Drug Use: No  . Sexual Activity: Not Asked   Other Topics Concern  . None   Social History Narrative    Review of Systems - See HPI.  All other ROS are negative.  BP 104/74 mmHg  Pulse 84  Temp(Src) 98.8 F (37.1 C) (Oral)  Resp 16  Ht 5\' 5"  (1.651 m)  Wt 124 lb (56.246 kg)   BMI 20.63 kg/m2  SpO2 95%  LMP 03/03/2016  Physical Exam  Constitutional: She is oriented to person, place, and time and well-developed, well-nourished, and in no distress.  HENT:  Head: Normocephalic and atraumatic.  Eyes: Conjunctivae are normal.  Neck: Neck supple.  Cardiovascular: Normal rate, regular rhythm, normal heart sounds and intact distal pulses.   Pulmonary/Chest: Effort normal and breath sounds normal. No respiratory distress. She has no wheezes. She has no rales. She exhibits no tenderness.  Abdominal: Soft. Normal appearance and bowel sounds are normal. There is no hepatosplenomegaly. There is tenderness in the suprapubic area. There is no CVA tenderness. No hernia.  Neurological: She is alert and oriented to person, place, and time.  Skin: Skin is warm and dry. No rash noted.  Psychiatric: Affect normal.  Vitals reviewed.   Assessment/Plan: 1. Dysuria Urine dip with + blood and LE. Suprapubic pressure noted on examination. Will send for culture. Rx Cipro 500 mg BID x 3 days. Supportive measures review. Will extend course based on culture results. - POCT urinalysis dipstick; Standing - CULTURE, URINE COMPREHENSIVE - POCT urinalysis dipstick  2. Constipation, unspecified constipation type Bowel regimen reviewed with patient. Hydration is key. FU if not resolving.    Piedad ClimesMartin, Errin [place]aw Cody, PA-C

## 2016-03-06 NOTE — Progress Notes (Signed)
Pre visit review using our clinic review tool, if applicable. No additional management support is needed unless otherwise documented below in the visit note/SLS  

## 2016-03-09 LAB — CULTURE, URINE COMPREHENSIVE: Colony Count: >=100000

## 2016-03-14 ENCOUNTER — Ambulatory Visit (INDEPENDENT_AMBULATORY_CARE_PROVIDER_SITE_OTHER): Payer: Commercial Managed Care - PPO | Admitting: Physician Assistant

## 2016-03-14 ENCOUNTER — Encounter: Payer: Self-pay | Admitting: Physician Assistant

## 2016-03-14 VITALS — BP 108/61 | HR 64 | Temp 97.6°F | Resp 16 | Ht 65.5 in | Wt 121.0 lb

## 2016-03-14 DIAGNOSIS — Z111 Encounter for screening for respiratory tuberculosis: Secondary | ICD-10-CM

## 2016-03-14 DIAGNOSIS — Z0184 Encounter for antibody response examination: Secondary | ICD-10-CM

## 2016-03-14 DIAGNOSIS — Z0000 Encounter for general adult medical examination without abnormal findings: Secondary | ICD-10-CM

## 2016-03-14 DIAGNOSIS — Z23 Encounter for immunization: Secondary | ICD-10-CM | POA: Diagnosis not present

## 2016-03-14 NOTE — Progress Notes (Signed)
Pre visit review using our clinic review tool, if applicable. No additional management support is needed unless otherwise documented below in the visit note/SLS  

## 2016-03-14 NOTE — Assessment & Plan Note (Signed)
Depression screen negative. Health Maintenance reviewed -- TDaP updated. Will obtain PAP at next visit. Preventive schedule discussed and handout given in AVS. Will obtain labs for immunity status testing for school.  Will obtain fasting labs today.

## 2016-03-14 NOTE — Addendum Note (Signed)
Addended by: Regis BillSCATES, SHARON L on: 03/14/2016 12:41 PM   Modules accepted: Orders, SmartSet

## 2016-03-14 NOTE — Progress Notes (Signed)
Patient presents to clinic today for annual exam.  Patient is fasting for labs. Needs immunity titers and forms for school. Body mass index is 19.82 kg/(m^2). Endorses well-balanced diet. Is staying active with work.   Acute Concerns: Denies acute concerns today.  Health Maintenance: Immunizations -- Needs TDap today.  PAP -- Due. Declines today. Will schedule for August.   No past medical history on file.  No past surgical history on file.  Current Outpatient Prescriptions on File Prior to Visit  Medication Sig Dispense Refill  . clobetasol ointment (TEMOVATE) 0.05 % Apply 1 application topically 2 (two) times daily. 30 g 0  . Dexmethylphenidate HCl 40 MG CP24 Take 1 capsule (40 mg total) by mouth daily. 30 capsule 0  . erythromycin with ethanol (EMGEL) 2 % gel Apply topically daily. 30 g 0  . ibuprofen (ADVIL,MOTRIN) 200 MG tablet Take 200 mg by mouth every 6 (six) hours as needed.     No current facility-administered medications on file prior to visit.    Allergies  Allergen Reactions  . Adderall [Amphetamine-Dextroamphetamine] Hives    Also with insomnia and weight loss  . Albuterol Sulfate     Seizures     Family History  Problem Relation Age of Onset  . Cancer Maternal Grandmother 53    breast  . Hypertension Maternal Grandfather   . Diabetes Maternal Grandfather   . Cancer Paternal Grandfather     throat cancer    Social History   Social History  . Marital Status: Single    Spouse Name: N/A  . Number of Children: N/A  . Years of Education: N/A   Occupational History  . Not on file.   Social History Main Topics  . Smoking status: Never Smoker   . Smokeless tobacco: Never Used  . Alcohol Use: No  . Drug Use: No  . Sexual Activity: Not on file   Other Topics Concern  . Not on file   Social History Narrative   Review of Systems  Constitutional: Negative for fever and weight loss.  HENT: Negative for ear discharge, ear pain, hearing loss and  tinnitus.   Eyes: Negative for blurred vision, double vision, photophobia and pain.  Respiratory: Negative for cough and shortness of breath.   Cardiovascular: Negative for chest pain and palpitations.  Gastrointestinal: Negative for heartburn, nausea, vomiting, abdominal pain, diarrhea, constipation, blood in stool and melena.  Genitourinary: Negative for dysuria, urgency, frequency, hematuria and flank pain.  Musculoskeletal: Negative for falls.  Neurological: Negative for dizziness, loss of consciousness and headaches.  Endo/Heme/Allergies: Negative for environmental allergies.  Psychiatric/Behavioral: Negative for depression, suicidal ideas, hallucinations and substance abuse. The patient is not nervous/anxious and does not have insomnia.    BP 108/61 mmHg  Pulse 64  Temp(Src) 97.6 F (36.4 C) (Oral)  Resp 16  Ht 5' 5.5" (1.664 m)  Wt 121 lb (54.885 kg)  BMI 19.82 kg/m2  SpO2 100%  LMP 03/03/2016  Physical Exam  Constitutional: She is oriented to person, place, and time and well-developed, well-nourished, and in no distress.  HENT:  Head: Normocephalic and atraumatic.  Right Ear: Tympanic membrane, external ear and ear canal normal.  Left Ear: Tympanic membrane, external ear and ear canal normal.  Nose: Nose normal. No mucosal edema.  Mouth/Throat: Uvula is midline, oropharynx is clear and moist and mucous membranes are normal. No oropharyngeal exudate or posterior oropharyngeal erythema.  Eyes: Conjunctivae are normal. Pupils are equal, round, and reactive to light.  Neck: Neck supple. No thyromegaly present.  Cardiovascular: Normal rate, regular rhythm, normal heart sounds and intact distal pulses.   Pulmonary/Chest: Effort normal and breath sounds normal. No respiratory distress. She has no wheezes. She has no rales.  Abdominal: Soft. Bowel sounds are normal. She exhibits no distension and no mass. There is no tenderness. There is no rebound and no guarding.    Lymphadenopathy:    She has no cervical adenopathy.  Neurological: She is alert and oriented to person, place, and time. No cranial nerve deficit.  Skin: Skin is warm and dry. No rash noted.  Psychiatric: Affect normal.  Vitals reviewed.   Recent Results (from the past 2160 hour(s))  CULTURE, URINE COMPREHENSIVE     Status: None   Collection Time: 03/06/16  3:30 PM  Result Value Ref Range   Culture ESCHERICHIA COLI    Colony Count >=100,000 COLONIES/ML    Organism ID, Bacteria ESCHERICHIA COLI       Susceptibility   Escherichia coli -  (no method available)    AMPICILLIN <=2 Sensitive     AMOX/CLAVULANIC <=2 Sensitive     AMPICILLIN/SULBACTAM <=2 Sensitive     PIP/TAZO <=4 Sensitive     IMIPENEM <=0.25 Sensitive     CEFAZOLIN <=4 Not Reportable     CEFTRIAXONE <=1 Sensitive     CEFTAZIDIME <=1 Sensitive     CEFEPIME <=1 Sensitive     GENTAMICIN <=1 Sensitive     TOBRAMYCIN <=1 Sensitive     CIPROFLOXACIN <=0.25 Sensitive     LEVOFLOXACIN <=0.12 Sensitive     NITROFURANTOIN <=16 Sensitive     TRIMETH/SULFA* <=20 Sensitive      * NR=NOT REPORTABLE,SEE COMMENTORAL therapy:A cefazolin MIC of <32 predicts susceptibility to the oral agents cefaclor,cefdinir,cefpodoxime,cefprozil,cefuroxime,cephalexin,and loracarbef when used for therapy of uncomplicated UTIs due to E.coli,K.pneumomiae,and P.mirabilis. PARENTERAL therapy: A cefazolinMIC of >8 indicates resistance to parenteralcefazolin. An alternate test method must beperformed to confirm susceptibility to parenteralcefazolin.  POCT urinalysis dipstick     Status: Abnormal   Collection Time: 03/06/16  3:30 PM  Result Value Ref Range   Color, UA yellow    Clarity, UA clear    Glucose, UA neg    Bilirubin, UA neg    Ketones, UA neg    Spec Grav, UA 1.025    Blood, UA trace     Comment: Hemolyzed   pH, UA 7.0    Protein, UA neg    Urobilinogen, UA 0.2    Nitrite, UA neg    Leukocytes, UA small (1+) (A) Negative     Assessment/Plan: Visit for preventive health examination Depression screen negative. Health Maintenance reviewed -- TDaP updated. Will obtain PAP at next visit. Preventive schedule discussed and handout given in AVS. Will obtain labs for immunity status testing for school.  Will obtain fasting labs today.      Piedad ClimesMartin, Wealthy Danielski Cody, PA-C

## 2016-03-14 NOTE — Patient Instructions (Signed)
Please go to the lab for blood work and urine sample. I will call with results so you can pick them up for school.  Your TDaP will be up-to-date after today so I will include documentation of this with your results for pick up.  Good luck at school.  Preventive Care for Adults, Female A healthy lifestyle and preventive care can promote health and wellness. Preventive health guidelines for women include the following key practices.  A routine yearly physical is a good way to check with your health care provider about your health and preventive screening. It is a chance to share any concerns and updates on your health and to receive a thorough exam.  Visit your dentist for a routine exam and preventive care every 6 months. Brush your teeth twice a day and floss once a day. Good oral hygiene prevents tooth decay and gum disease.  The frequency of eye exams is based on your age, health, family medical history, use of contact lenses, and other factors. Follow your health care provider's recommendations for frequency of eye exams.  Eat a healthy diet. Foods like vegetables, fruits, whole grains, low-fat dairy products, and lean protein foods contain the nutrients you need without too many calories. Decrease your intake of foods high in solid fats, added sugars, and salt. Eat the right amount of calories for you.Get information about a proper diet from your health care provider, if necessary.  Regular physical exercise is one of the most important things you can do for your health. Most adults should get at least 150 minutes of moderate-intensity exercise (any activity that increases your heart rate and causes you to sweat) each week. In addition, most adults need muscle-strengthening exercises on 2 or more days a week.  Maintain a healthy weight. The body mass index (BMI) is a screening tool to identify possible weight problems. It provides an estimate of body fat based on height and weight. Your  health care provider can find your BMI and can help you achieve or maintain a healthy weight.For adults 20 years and older:  A BMI below 18.5 is considered underweight.  A BMI of 18.5 to 24.9 is normal.  A BMI of 25 to 29.9 is considered overweight.  A BMI of 30 and above is considered obese.  Maintain normal blood lipids and cholesterol levels by exercising and minimizing your intake of saturated fat. Eat a balanced diet with plenty of fruit and vegetables. Blood tests for lipids and cholesterol should begin at age 24 and be repeated every 5 years. If your lipid or cholesterol levels are high, you are over 50, or you are at high risk for heart disease, you may need your cholesterol levels checked more frequently.Ongoing high lipid and cholesterol levels should be treated with medicines if diet and exercise are not working.  If you smoke, find out from your health care provider how to quit. If you do not use tobacco, do not start.  Lung cancer screening is recommended for adults aged 103-80 years who are at high risk for developing lung cancer because of a history of smoking. A yearly low-dose CT scan of the lungs is recommended for people who have at least a 30-pack-year history of smoking and are a current smoker or have quit within the past 15 years. A pack year of smoking is smoking an average of 1 pack of cigarettes a day for 1 year (for example: 1 pack a day for 30 years or 2 packs a day  for 15 years). Yearly screening should continue until the smoker has stopped smoking for at least 15 years. Yearly screening should be stopped for people who develop a health problem that would prevent them from having lung cancer treatment.  If you are pregnant, do not drink alcohol. If you are breastfeeding, be very cautious about drinking alcohol. If you are not pregnant and choose to drink alcohol, do not have more than 1 drink per day. One drink is considered to be 12 ounces (355 mL) of beer, 5 ounces  (148 mL) of wine, or 1.5 ounces (44 mL) of liquor.  Avoid use of street drugs. Do not share needles with anyone. Ask for help if you need support or instructions about stopping the use of drugs.  High blood pressure causes heart disease and increases the risk of stroke. Your blood pressure should be checked at least every 1 to 2 years. Ongoing high blood pressure should be treated with medicines if weight loss and exercise do not work.  If you are 38-39 years old, ask your health care provider if you should take aspirin to prevent strokes.  Diabetes screening is done by taking a blood sample to check your blood glucose level after you have not eaten for a certain period of time (fasting). If you are not overweight and you do not have risk factors for diabetes, you should be screened once every 3 years starting at age 11. If you are overweight or obese and you are 43-76 years of age, you should be screened for diabetes every year as part of your cardiovascular risk assessment.  Breast cancer screening is essential preventive care for women. You should practice "breast self-awareness." This means understanding the normal appearance and feel of your breasts and may include breast self-examination. Any changes detected, no matter how small, should be reported to a health care provider. Women in their 26s and 30s should have a clinical breast exam (CBE) by a health care provider as part of a regular health exam every 1 to 3 years. After age 6, women should have a CBE every year. Starting at age 54, women should consider having a mammogram (breast X-ray test) every year. Women who have a family history of breast cancer should talk to their health care provider about genetic screening. Women at a high risk of breast cancer should talk to their health care providers about having an MRI and a mammogram every year.  Breast cancer gene (BRCA)-related cancer risk assessment is recommended for women who have family  members with BRCA-related cancers. BRCA-related cancers include breast, ovarian, tubal, and peritoneal cancers. Having family members with these cancers may be associated with an increased risk for harmful changes (mutations) in the breast cancer genes BRCA1 and BRCA2. Results of the assessment will determine the need for genetic counseling and BRCA1 and BRCA2 testing.  Your health care provider may recommend that you be screened regularly for cancer of the pelvic organs (ovaries, uterus, and vagina). This screening involves a pelvic examination, including checking for microscopic changes to the surface of your cervix (Pap test). You may be encouraged to have this screening done every 3 years, beginning at age 33.  For women ages 39-65, health care providers may recommend pelvic exams and Pap testing every 3 years, or they may recommend the Pap and pelvic exam, combined with testing for human papilloma virus (HPV), every 5 years. Some types of HPV increase your risk of cervical cancer. Testing for HPV may also be  done on women of any age with unclear Pap test results.  Other health care providers may not recommend any screening for nonpregnant women who are considered low risk for pelvic cancer and who do not have symptoms. Ask your health care provider if a screening pelvic exam is right for you.  If you have had past treatment for cervical cancer or a condition that could lead to cancer, you need Pap tests and screening for cancer for at least 20 years after your treatment. If Pap tests have been discontinued, your risk factors (such as having a new sexual partner) need to be reassessed to determine if screening should resume. Some women have medical problems that increase the chance of getting cervical cancer. In these cases, your health care provider may recommend more frequent screening and Pap tests.  Colorectal cancer can be detected and often prevented. Most routine colorectal cancer screening  begins at the age of 61 years and continues through age 84 years. However, your health care provider may recommend screening at an earlier age if you have risk factors for colon cancer. On a yearly basis, your health care provider may provide home test kits to check for hidden blood in the stool. Use of a small camera at the end of a tube, to directly examine the colon (sigmoidoscopy or colonoscopy), can detect the earliest forms of colorectal cancer. Talk to your health care provider about this at age 61, when routine screening begins. Direct exam of the colon should be repeated every 5-10 years through age 29 years, unless early forms of precancerous polyps or small growths are found.  People who are at an increased risk for hepatitis B should be screened for this virus. You are considered at high risk for hepatitis B if:  You were born in a country where hepatitis B occurs often. Talk with your health care provider about which countries are considered high risk.  Your parents were born in a high-risk country and you have not received a shot to protect against hepatitis B (hepatitis B vaccine).  You have HIV or AIDS.  You use needles to inject street drugs.  You live with, or have sex with, someone who has hepatitis B.  You get hemodialysis treatment.  You take certain medicines for conditions like cancer, organ transplantation, and autoimmune conditions.  Hepatitis C blood testing is recommended for all people born from [birth year] through 1965 and any individual with known risks for hepatitis C.  Practice safe sex. Use condoms and avoid high-risk sexual practices to reduce the spread of sexually transmitted infections (STIs). STIs include gonorrhea, chlamydia, syphilis, trichomonas, herpes, HPV, and human immunodeficiency virus (HIV). Herpes, HIV, and HPV are viral illnesses that have no cure. They can result in disability, cancer, and death.  You should be screened for sexually transmitted  illnesses (STIs) including gonorrhea and chlamydia if:  You are sexually active and are younger than 24 years.  You are older than 24 years and your health care provider tells you that you are at risk for this type of infection.  Your sexual activity has changed since you were last screened and you are at an increased risk for chlamydia or gonorrhea. Ask your health care provider if you are at risk.  If you are at risk of being infected with HIV, it is recommended that you take a prescription medicine daily to prevent HIV infection. This is called preexposure prophylaxis (PrEP). You are considered at risk if:  You are sexually active  and do not regularly use condoms or know the HIV status of your partner(s).  You take drugs by injection.  You are sexually active with a partner who has HIV.  Talk with your health care provider about whether you are at high risk of being infected with HIV. If you choose to begin PrEP, you should first be tested for HIV. You should then be tested every 3 months for as long as you are taking PrEP.  Osteoporosis is a disease in which the bones lose minerals and strength with aging. This can result in serious bone fractures or breaks. The risk of osteoporosis can be identified using a bone density scan. Women ages 68 years and over and women at risk for fractures or osteoporosis should discuss screening with their health care providers. Ask your health care provider whether you should take a calcium supplement or vitamin D to reduce the rate of osteoporosis.  Menopause can be associated with physical symptoms and risks. Hormone replacement therapy is available to decrease symptoms and risks. You should talk to your health care provider about whether hormone replacement therapy is right for you.  Use sunscreen. Apply sunscreen liberally and repeatedly throughout the day. You should seek shade when your shadow is shorter than you. Protect yourself by wearing long  sleeves, pants, a wide-brimmed hat, and sunglasses year round, whenever you are outdoors.  Once a month, do a whole body skin exam, using a mirror to look at the skin on your back. Tell your health care provider of new moles, moles that have irregular borders, moles that are larger than a pencil eraser, or moles that have changed in shape or color.  Stay current with required vaccines (immunizations).  Influenza vaccine. All adults should be immunized every year.  Tetanus, diphtheria, and acellular pertussis (Td, Tdap) vaccine. Pregnant women should receive 1 dose of Tdap vaccine during each pregnancy. The dose should be obtained regardless of the length of time since the last dose. Immunization is preferred during the 27th-36th week of gestation. An adult who has not previously received Tdap or who does not know her vaccine status should receive 1 dose of Tdap. This initial dose should be followed by tetanus and diphtheria toxoids (Td) booster doses every 10 years. Adults with an unknown or incomplete history of completing a 3-dose immunization series with Td-containing vaccines should begin or complete a primary immunization series including a Tdap dose. Adults should receive a Td booster every 10 years.  Varicella vaccine. An adult without evidence of immunity to varicella should receive 2 doses or a second dose if she has previously received 1 dose. Pregnant females who do not have evidence of immunity should receive the first dose after pregnancy. This first dose should be obtained before leaving the health care facility. The second dose should be obtained 4-8 weeks after the first dose.  Human papillomavirus (HPV) vaccine. Females aged 13-26 years who have not received the vaccine previously should obtain the 3-dose series. The vaccine is not recommended for use in pregnant females. However, pregnancy testing is not needed before receiving a dose. If a female is found to be pregnant after receiving  a dose, no treatment is needed. In that case, the remaining doses should be delayed until after the pregnancy. Immunization is recommended for any person with an immunocompromised condition through the age of 58 years if she did not get any or all doses earlier. During the 3-dose series, the second dose should be obtained 4-8 weeks  after the first dose. The third dose should be obtained 24 weeks after the first dose and 16 weeks after the second dose.  Zoster vaccine. One dose is recommended for adults aged 90 years or older unless certain conditions are present.  Measles, mumps, and rubella (MMR) vaccine. Adults born before [birth year] generally are considered immune to measles and mumps. Adults born in [birth year] or later should have 1 or more doses of MMR vaccine unless there is a contraindication to the vaccine or there is laboratory evidence of immunity to each of the three diseases. A routine second dose of MMR vaccine should be obtained at least 28 days after the first dose for students attending postsecondary schools, health care workers, or international travelers. People who received inactivated measles vaccine or an unknown type of measles vaccine during 1963-1967 should receive 2 doses of MMR vaccine. People who received inactivated mumps vaccine or an unknown type of mumps vaccine before 1979 and are at high risk for mumps infection should consider immunization with 2 doses of MMR vaccine. For females of childbearing age, rubella immunity should be determined. If there is no evidence of immunity, females who are not pregnant should be vaccinated. If there is no evidence of immunity, females who are pregnant should delay immunization until after pregnancy. Unvaccinated health care workers born before [birth year] who lack laboratory evidence of measles, mumps, or rubella immunity or laboratory confirmation of disease should consider measles and mumps immunization with 2 doses of MMR vaccine or rubella immunization with 1  dose of MMR vaccine.  Pneumococcal 13-valent conjugate (PCV13) vaccine. When indicated, a person who is uncertain of his immunization history and has no record of immunization should receive the PCV13 vaccine. All adults 41 years of age and older should receive this vaccine. An adult aged 73 years or older who has certain medical conditions and has not been previously immunized should receive 1 dose of PCV13 vaccine. This PCV13 should be followed with a dose of pneumococcal polysaccharide (PPSV23) vaccine. Adults who are at high risk for pneumococcal disease should obtain the PPSV23 vaccine at least 8 weeks after the dose of PCV13 vaccine. Adults older than 21 years of age who have normal immune system function should obtain the PPSV23 vaccine dose at least 1 year after the dose of PCV13 vaccine.  Pneumococcal polysaccharide (PPSV23) vaccine. When PCV13 is also indicated, PCV13 should be obtained first. All adults aged 97 years and older should be immunized. An adult younger than age 61 years who has certain medical conditions should be immunized. Any person who resides in a nursing home or long-term care facility should be immunized. An adult smoker should be immunized. People with an immunocompromised condition and certain other conditions should receive both PCV13 and PPSV23 vaccines. People with human immunodeficiency virus (HIV) infection should be immunized as soon as possible after diagnosis. Immunization during chemotherapy or radiation therapy should be avoided. Routine use of PPSV23 vaccine is not recommended for American Indians, [place] Natives, or people younger than 65 years unless there are medical conditions that require PPSV23 vaccine. When indicated, people who have unknown immunization and have no record of immunization should receive PPSV23 vaccine. One-time revaccination 5 years after the first dose of PPSV23 is recommended for people aged 19-64 years who have chronic kidney failure,  nephrotic syndrome, asplenia, or immunocompromised conditions. People who received 1-2 doses of PPSV23 before age 12 years should receive another dose of PPSV23 vaccine at age 69 years or later if at least  5 years have passed since the previous dose. Doses of PPSV23 are not needed for people immunized with PPSV23 at or after age 21 years.  Meningococcal vaccine. Adults with asplenia or persistent complement component deficiencies should receive 2 doses of quadrivalent meningococcal conjugate (MenACWY-D) vaccine. The doses should be obtained at least 2 months apart. Microbiologists working with certain meningococcal bacteria, [place] recruits, people at risk during an outbreak, and people who travel to or live in countries with a high rate of meningitis should be immunized. A first-year college student up through age 73 years who is living in a residence hall should receive a dose if she did not receive a dose on or after her 16th birthday. Adults who have certain high-risk conditions should receive one or more doses of vaccine.  Hepatitis A vaccine. Adults who wish to be protected from this disease, have certain high-risk conditions, work with hepatitis A-infected animals, work in hepatitis A research labs, or travel to or work in countries with a high rate of hepatitis A should be immunized. Adults who were previously unvaccinated and who anticipate close contact with an international adoptee during the first 60 days after arrival in the [place] from a country with a high rate of hepatitis A should be immunized.  Hepatitis B vaccine. Adults who wish to be protected from this disease, have certain high-risk conditions, may be exposed to blood or other infectious body fluids, are household contacts or sex partners of hepatitis B positive people, are clients or workers in certain care facilities, or travel to or work in countries with a high rate of hepatitis B should be immunized.  Haemophilus  influenzae type b (Hib) vaccine. A previously unvaccinated person with asplenia or sickle cell disease or having a scheduled splenectomy should receive 1 dose of Hib vaccine. Regardless of previous immunization, a recipient of a hematopoietic stem cell transplant should receive a 3-dose series 6-12 months after her successful transplant. Hib vaccine is not recommended for adults with HIV infection. Preventive Services / Frequency Ages 31 to 63 years  Blood pressure check.** / Every 3-5 years.  Lipid and cholesterol check.** / Every 5 years beginning at age 4.  Clinical breast exam.** / Every 3 years for women in their 28s and 67s.  BRCA-related cancer risk assessment.** / For women who have family members with a BRCA-related cancer (breast, ovarian, tubal, or peritoneal cancers).  Pap test.** / Every 2 years from ages 66 through 89. Every 3 years starting at age 61 through age 73 or 82 with a history of 3 consecutive normal Pap tests.  HPV screening.** / Every 3 years from ages 60 through ages 26 to 81 with a history of 3 consecutive normal Pap tests.  Hepatitis C blood test.** / For any individual with known risks for hepatitis C.  Skin self-exam. / Monthly.  Influenza vaccine. / Every year.  Tetanus, diphtheria, and acellular pertussis (Tdap, Td) vaccine.** / Consult your health care provider. Pregnant women should receive 1 dose of Tdap vaccine during each pregnancy. 1 dose of Td every 10 years.  Varicella vaccine.** / Consult your health care provider. Pregnant females who do not have evidence of immunity should receive the first dose after pregnancy.  HPV vaccine. / 3 doses over 6 months, if 90 and younger. The vaccine is not recommended for use in pregnant females. However, pregnancy testing is not needed before receiving a dose.  Measles, mumps, rubella (MMR) vaccine.** / You need at least 1 dose of  MMR if you were born in 1957 or later. You may also need a 2nd dose. For females  of childbearing age, rubella immunity should be determined. If there is no evidence of immunity, females who are not pregnant should be vaccinated. If there is no evidence of immunity, females who are pregnant should delay immunization until after pregnancy.  Pneumococcal 13-valent conjugate (PCV13) vaccine.** / Consult your health care provider.  Pneumococcal polysaccharide (PPSV23) vaccine.** / 1 to 2 doses if you smoke cigarettes or if you have certain conditions.  Meningococcal vaccine.** / 1 dose if you are age 1 to 11 years and a Market researcher living in a residence hall, or have one of several medical conditions, you need to get vaccinated against meningococcal disease. You may also need additional booster doses.  Hepatitis A vaccine.** / Consult your health care provider.  Hepatitis B vaccine.** / Consult your health care provider.  Haemophilus influenzae type b (Hib) vaccine.** / Consult your health care provider. Ages 63 to 71 years  Blood pressure check.** / Every year.  Lipid and cholesterol check.** / Every 5 years beginning at age 32 years.  Lung cancer screening. / Every year if you are aged 52-80 years and have a 30-pack-year history of smoking and currently smoke or have quit within the past 15 years. Yearly screening is stopped once you have quit smoking for at least 15 years or develop a health problem that would prevent you from having lung cancer treatment.  Clinical breast exam.** / Every year after age 45 years.  BRCA-related cancer risk assessment.** / For women who have family members with a BRCA-related cancer (breast, ovarian, tubal, or peritoneal cancers).  Mammogram.** / Every year beginning at age 67 years and continuing for as long as you are in good health. Consult with your health care provider.  Pap test.** / Every 3 years starting at age 41 years through age 10 or 82 years with a history of 3 consecutive normal Pap tests.  HPV screening.**  / Every 3 years from ages 35 years through ages 52 to 67 years with a history of 3 consecutive normal Pap tests.  Fecal occult blood test (FOBT) of stool. / Every year beginning at age 84 years and continuing until age 50 years. You may not need to do this test if you get a colonoscopy every 10 years.  Flexible sigmoidoscopy or colonoscopy.** / Every 5 years for a flexible sigmoidoscopy or every 10 years for a colonoscopy beginning at age 29 years and continuing until age 36 years.  Hepatitis C blood test.** / For all people born from [birth year] through 1966 and any individual with known risks for hepatitis C.  Skin self-exam. / Monthly.  Influenza vaccine. / Every year.  Tetanus, diphtheria, and acellular pertussis (Tdap/Td) vaccine.** / Consult your health care provider. Pregnant women should receive 1 dose of Tdap vaccine during each pregnancy. 1 dose of Td every 10 years.  Varicella vaccine.** / Consult your health care provider. Pregnant females who do not have evidence of immunity should receive the first dose after pregnancy.  Zoster vaccine.** / 1 dose for adults aged 61 years or older.  Measles, mumps, rubella (MMR) vaccine.** / You need at least 1 dose of MMR if you were born in 1957 or later. You may also need a second dose. For females of childbearing age, rubella immunity should be determined. If there is no evidence of immunity, females who are not pregnant should be vaccinated. If there  is no evidence of immunity, females who are pregnant should delay immunization until after pregnancy.  Pneumococcal 13-valent conjugate (PCV13) vaccine.** / Consult your health care provider.  Pneumococcal polysaccharide (PPSV23) vaccine.** / 1 to 2 doses if you smoke cigarettes or if you have certain conditions.  Meningococcal vaccine.** / Consult your health care provider.  Hepatitis A vaccine.** / Consult your health care provider.  Hepatitis B vaccine.** / Consult your health care  provider.  Haemophilus influenzae type b (Hib) vaccine.** / Consult your health care provider. Ages 65 years and over  Blood pressure check.** / Every year.  Lipid and cholesterol check.** / Every 5 years beginning at age 44 years.  Lung cancer screening. / Every year if you are aged 55-80 years and have a 30-pack-year history of smoking and currently smoke or have quit within the past 15 years. Yearly screening is stopped once you have quit smoking for at least 15 years or develop a health problem that would prevent you from having lung cancer treatment.  Clinical breast exam.** / Every year after age 95 years.  BRCA-related cancer risk assessment.** / For women who have family members with a BRCA-related cancer (breast, ovarian, tubal, or peritoneal cancers).  Mammogram.** / Every year beginning at age 22 years and continuing for as long as you are in good health. Consult with your health care provider.  Pap test.** / Every 3 years starting at age 66 years through age 38 or 37 years with 3 consecutive normal Pap tests. Testing can be stopped between 65 and 70 years with 3 consecutive normal Pap tests and no abnormal Pap or HPV tests in the past 10 years.  HPV screening.** / Every 3 years from ages 33 years through ages 45 or 87 years with a history of 3 consecutive normal Pap tests. Testing can be stopped between 65 and 70 years with 3 consecutive normal Pap tests and no abnormal Pap or HPV tests in the past 10 years.  Fecal occult blood test (FOBT) of stool. / Every year beginning at age 9 years and continuing until age 19 years. You may not need to do this test if you get a colonoscopy every 10 years.  Flexible sigmoidoscopy or colonoscopy.** / Every 5 years for a flexible sigmoidoscopy or every 10 years for a colonoscopy beginning at age 99 years and continuing until age 82 years.  Hepatitis C blood test.** / For all people born from [birth year] through 1966 and any individual with known  risks for hepatitis C.  Osteoporosis screening.** / A one-time screening for women ages 60 years and over and women at risk for fractures or osteoporosis.  Skin self-exam. / Monthly.  Influenza vaccine. / Every year.  Tetanus, diphtheria, and acellular pertussis (Tdap/Td) vaccine.** / 1 dose of Td every 10 years.  Varicella vaccine.** / Consult your health care provider.  Zoster vaccine.** / 1 dose for adults aged 35 years or older.  Pneumococcal 13-valent conjugate (PCV13) vaccine.** / Consult your health care provider.  Pneumococcal polysaccharide (PPSV23) vaccine.** / 1 dose for all adults aged 49 years and older.  Meningococcal vaccine.** / Consult your health care provider.  Hepatitis A vaccine.** / Consult your health care provider.  Hepatitis B vaccine.** / Consult your health care provider.  Haemophilus influenzae type b (Hib) vaccine.** / Consult your health care provider. ** Family history and personal history of risk and conditions may change your health care provider's recommendations.   This information is not intended to replace advice  given to you by your health care provider. Make sure you discuss any questions you have with your health care provider.   Document Released: 10/10/2001 Document Revised: 09/04/2014 Document Reviewed: 01/09/2011 Elsevier Interactive Patient Education [employer].

## 2016-03-15 LAB — MEASLES/MUMPS/RUBELLA IMMUNITY
Mumps IgG: 60 AU/mL — ABNORMAL HIGH (ref <9.00)
RUBELLA: 2.03 {index} — AB (ref <0.90)
Rubeola IgG: >300 AU/mL — ABNORMAL HIGH (ref <25.00)

## 2016-03-15 LAB — HEPATITIS B SURFACE ANTIBODY, QUANTITATIVE: Hepatitis B-Post: 0.4 m[IU]/mL

## 2016-03-16 LAB — QUANTIFERON TB GOLD ASSAY (BLOOD)
INTERFERON GAMMA RELEASE ASSAY: NEGATIVE
QUANTIFERON NIL VALUE: 0.03 [IU]/mL
QUANTIFERON TB AG MINUS NIL: 0.03 [IU]/mL

## 2016-03-20 DIAGNOSIS — Z23 Encounter for immunization: Secondary | ICD-10-CM | POA: Diagnosis not present

## 2016-03-20 NOTE — Addendum Note (Signed)
Addended by: Regis Bill on: 03/20/2016 09:37 AM   Modules accepted: Orders

## 2016-03-21 ENCOUNTER — Telehealth: Payer: Self-pay | Admitting: Physician Assistant

## 2016-03-21 NOTE — Telephone Encounter (Signed)
Pt called in to request a call back from CMA. Pt wouldn't go into details. She says that she has a question about conversation between her and the CMA from yesterday.

## 2016-03-22 NOTE — Telephone Encounter (Signed)
Spoke with patient RE: her Hep B vaccine; scheduled nurse visit for 04/07/16 at 3:00pm, we will have labs [titers] and Immunization record ready for school/SLS 07/26

## 2016-03-30 ENCOUNTER — Ambulatory Visit (INDEPENDENT_AMBULATORY_CARE_PROVIDER_SITE_OTHER): Payer: Commercial Managed Care - PPO | Admitting: *Deleted

## 2016-03-30 DIAGNOSIS — Z23 Encounter for immunization: Secondary | ICD-10-CM | POA: Diagnosis not present

## 2016-03-30 NOTE — Progress Notes (Signed)
Pre visit review using our clinic review tool, if applicable. No additional management support is needed unless otherwise documented below in the visit note.  Pt here for Hep B vaccine. Patient tolerated injection well. She will call to schedule next injection.  Starla Link, RN

## 2016-04-07 ENCOUNTER — Ambulatory Visit: Payer: Commercial Managed Care - PPO

## 2016-05-04 ENCOUNTER — Ambulatory Visit (INDEPENDENT_AMBULATORY_CARE_PROVIDER_SITE_OTHER): Payer: Commercial Managed Care - PPO | Admitting: Behavioral Health

## 2016-05-04 DIAGNOSIS — Z23 Encounter for immunization: Secondary | ICD-10-CM

## 2016-05-04 NOTE — Progress Notes (Signed)
Pre visit review using our clinic review tool, if applicable. No additional management support is needed unless otherwise documented below in the visit note.  Patient in clinic today for Hepatitis B vaccination (2nd). IM given in Right Deltoid. Patient tolerated injection well.  She will call the office at a later date to schedule an appointment for the 3rd injection.

## 2016-05-29 ENCOUNTER — Encounter: Payer: Self-pay | Admitting: Physician Assistant

## 2016-05-29 ENCOUNTER — Ambulatory Visit (INDEPENDENT_AMBULATORY_CARE_PROVIDER_SITE_OTHER): Payer: Commercial Managed Care - PPO | Admitting: Physician Assistant

## 2016-05-29 ENCOUNTER — Ambulatory Visit: Payer: Commercial Managed Care - PPO | Admitting: Physician Assistant

## 2016-05-29 DIAGNOSIS — F988 Other specified behavioral and emotional disorders with onset usually occurring in childhood and adolescence: Secondary | ICD-10-CM

## 2016-05-29 MED ORDER — DEXMETHYLPHENIDATE HCL ER 40 MG PO CP24: 40.0000 mg | ORAL_CAPSULE | Freq: Every day | ORAL | 0 refills | Status: DC

## 2016-05-29 NOTE — Progress Notes (Signed)
Pre visit review using our clinic review tool, if applicable. No additional management support is needed unless otherwise documented below in the visit note/SLS  

## 2016-05-29 NOTE — Assessment & Plan Note (Signed)
Doing extremely well. Will continue current medication regimen and continue taking only M-F. Follow-up 6 months.

## 2016-05-29 NOTE — Progress Notes (Signed)
Patient presents to clinic today for follow-up of ADD. Patient is currently on a regimen of dexmethylphenidate ER 40 mg once daily. Is taking as directed but on Monday through Friday only. Has previously done very well on this regimen. Endorses medication working well. Is staying very focused at school. Is currently ranked #1 in her dental hygiene program which she is very excited about. Denies noted side effect of medication. Is eating and sleeping well.   No past medical history on file.  Current Outpatient Prescriptions on File Prior to Visit  Medication Sig Dispense Refill  . clobetasol ointment (TEMOVATE) 0.05 % Apply 1 application topically 2 (two) times daily. (Patient taking differently: Apply 1 application topically as needed. ) 30 g 0  . erythromycin with ethanol (EMGEL) 2 % gel Apply topically daily. 30 g 0  . ibuprofen (ADVIL,MOTRIN) 200 MG tablet Take 200 mg by mouth every 6 (six) hours as needed.     No current facility-administered medications on file prior to visit.     Allergies  Allergen Reactions  . Adderall [Amphetamine-Dextroamphetamine] Hives    Also with insomnia and weight loss  . Albuterol Sulfate     Seizures     Family History  Problem Relation Age of Onset  . Cancer Maternal Grandmother 28    breast  . Hypertension Maternal Grandfather   . Diabetes Maternal Grandfather   . Cancer Paternal Grandfather     throat cancer    Social History   Social History  . Marital status: Single    Spouse name: N/A  . Number of children: N/A  . Years of education: N/A   Social History Main Topics  . Smoking status: Never Smoker  . Smokeless tobacco: Never Used  . Alcohol use No  . Drug use: No  . Sexual activity: Not Asked   Other Topics Concern  . None   Social History Narrative  . None    Review of Systems - See HPI.  All other ROS are negative.  BP 97/67 (BP Location: Left Arm, Patient Position: Sitting, Cuff Size: Normal)   Pulse 84   Temp  97.8 F (36.6 C) (Oral)   Resp 16   Ht 5\' 6"  (1.676 m)   Wt 121 lb 8 oz (55.1 kg)   LMP 05/22/2016   SpO2 98%   BMI 19.61 kg/m   Physical Exam  Constitutional: She is oriented to person, place, and time and well-developed, well-nourished, and in no distress.  HENT:  Head: Normocephalic and atraumatic.  Eyes: Conjunctivae are normal.  Cardiovascular: Normal rate, regular rhythm, normal heart sounds and intact distal pulses.   Pulmonary/Chest: Effort normal and breath sounds normal. No respiratory distress. She has no wheezes. She has no rales. She exhibits no tenderness.  Neurological: She is alert and oriented to person, place, and time.  Skin: Skin is warm and dry. No rash noted.  Psychiatric: Affect normal.  Vitals reviewed.   Recent Results (from the past 2160 hour(s))  CULTURE, URINE COMPREHENSIVE     Status: None   Collection Time: 03/06/16  3:30 PM  Result Value Ref Range   Culture ESCHERICHIA COLI    Colony Count >=100,000 COLONIES/ML    Organism ID, Bacteria ESCHERICHIA COLI       Susceptibility   Escherichia coli -  (no method available)    AMPICILLIN <=2 Sensitive     AMOX/CLAVULANIC <=2 Sensitive     AMPICILLIN/SULBACTAM <=2 Sensitive     PIP/TAZO <=4 Sensitive  IMIPENEM <=0.25 Sensitive     CEFAZOLIN <=4 Not Reportable     CEFTRIAXONE <=1 Sensitive     CEFTAZIDIME <=1 Sensitive     CEFEPIME <=1 Sensitive     GENTAMICIN <=1 Sensitive     TOBRAMYCIN <=1 Sensitive     CIPROFLOXACIN <=0.25 Sensitive     LEVOFLOXACIN <=0.12 Sensitive     NITROFURANTOIN <=16 Sensitive     TRIMETH/SULFA* <=20 Sensitive      * NR=NOT REPORTABLE,SEE COMMENTORAL therapy:A cefazolin MIC of <32 predicts susceptibility to the oral agents cefaclor,cefdinir,cefpodoxime,cefprozil,cefuroxime,cephalexin,and loracarbef when used for therapy of uncomplicated UTIs due to E.coli,K.pneumomiae,and P.mirabilis. PARENTERAL therapy: A cefazolinMIC of >8 indicates resistance to parenteralcefazolin.  An alternate test method must beperformed to confirm susceptibility to parenteralcefazolin.  POCT urinalysis dipstick     Status: Abnormal   Collection Time: 03/06/16  3:30 PM  Result Value Ref Range   Color, UA yellow    Clarity, UA clear    Glucose, UA neg    Bilirubin, UA neg    Ketones, UA neg    Spec Grav, UA 1.025    Blood, UA trace     Comment: Hemolyzed   pH, UA 7.0    Protein, UA neg    Urobilinogen, UA 0.2    Nitrite, UA neg    Leukocytes, UA small (1+) (A) Negative  Quantiferon tb gold assay     Status: None   Collection Time: 03/14/16 11:00 AM  Result Value Ref Range   Interferon Gamma Release Assay NEGATIVE NEGATIVE    Comment: Negative test result. M. tuberculosis complex infection unlikely.   Quantiferon Nil Value 0.03 IU/mL   Mitogen-Nil >10.00 IU/mL   Quantiferon Tb Ag Minus Nil Value 0.03 IU/mL    Comment:   The Nil tube value is used to determine if the patient has a preexisting immune response which could cause a false-positive reading on the test. In order for a test to be valid, the Nil tube must have a value of less than or equal to 8.0 IU/mL.   The mitogen control tube is used to assure the patient has a healthy immune status and also serves as a control for correct blood handling and incubation. It is used to detect false-negative readings. The mitogen tube must have a gamma interferon value of greater than or equal to 0.5 IU/mL higher than the value of the Nil tube.   The TB antigen tube is coated with the M. tuberculosis specific antigens. For a test to be considered positive, the TB antigen tube value minus the Nil tube value must be greater than or equal to 0.35 IU/mL.   For additional information, please refer to http://education.questdiagnostics.com/faq/QFT (This link is being provided for informational/educational purposes only.)   Measles/Mumps/Rubella Immunity     Status: Abnormal   Collection Time: 03/14/16 11:00 AM  Result Value Ref  Range   Rubella 2.03 (H) <0.90 Index    Comment:                  Index          Interpretation                =====          ==============                <0.90          Not consistent with immunity  0.90-0.99      Equivocal                >=1.00         Consistent with immunity   The presence of Rubella IgG antibody suggests immunization or past or current infection with Rubella virus.    Mumps IgG 60.00 (H) <9.00 AU/mL    Comment:        AU/mL                Interpretation      =====                ==============      < 9.00               Negative      9.00 - 10.99         Equivocal      > 10.99              Positive   A positive result indicates that the patient has antibody to mumps virus. It does not differentiate between an active or past infection. The clinical diagnosis must be interpreted in conjunction with clinical signs and symptoms of the patient.    Rubeola IgG >300.00 (H) <25.00 AU/mL    Comment:        AU/mL                Interpretation      =====                ==============      < 25.00              Negative      25.00 - 29.99        Equivocal      > 29.99              Positive   A positive result indicates that the patient has antibody to measles virus. It does not differentiate between an active or past infection. The clinical diagnosis must be interpreted in conjunction with clinical signs and symptoms of the patient.   Hepatitis B surface antibody     Status: None   Collection Time: 03/14/16 11:00 AM  Result Value Ref Range   Hepatitis B-Post 0.4 mIU/mL    Comment: A level of 10.0 mIU/mL or greater after 3 doses of Hepatitis B Vaccine suggests immunity to Hepatitis B.   This test is performed using the [employer] Chemiluminescence method.  Quantitative results from this method should not be used interchangeably with other methods.     Assessment/Plan: ADD (attention deficit disorder) Doing extremely well. Will continue  current medication regimen and continue taking only M-F. Follow-up 6 months.    Piedad Climes, PA-C

## 2016-05-29 NOTE — Patient Instructions (Signed)
I am glad you are doing so well! Please continue medications as directed. Follow-up with me in 6 months. Return sooner if needed.

## 2016-10-06 ENCOUNTER — Ambulatory Visit (INDEPENDENT_AMBULATORY_CARE_PROVIDER_SITE_OTHER): Payer: Commercial Managed Care - PPO | Admitting: Behavioral Health

## 2016-10-06 DIAGNOSIS — Z23 Encounter for immunization: Secondary | ICD-10-CM

## 2016-10-06 NOTE — Progress Notes (Signed)
Pre visit review using our clinic review tool, if applicable. No additional management support is needed unless otherwise documented below in the visit note.  Patient came in clinic today for #3 Hepatitis B injection. IM given in Right Deltoid. Patient tolerated injection well.

## 2016-10-19 ENCOUNTER — Telehealth: Payer: Self-pay | Admitting: Physician Assistant

## 2016-10-19 NOTE — Telephone Encounter (Signed)
Dexmethylphenidate 40 mg  Last filled 05/29/16 #30 Last ov: 03/14/16 CPE Please advise of refill.

## 2016-10-19 NOTE — Telephone Encounter (Signed)
Relation to ON:GEXBpt:self Call back number: [phone]   Reason for call:  Patient requesting a refill Dexmethylphenidate HCl 40 MG CP24, [facility]

## 2016-10-20 MED ORDER — DEXMETHYLPHENIDATE HCL ER 40 MG PO CP24: 40.0000 mg | ORAL_CAPSULE | Freq: Every day | ORAL | 0 refills | Status: DC

## 2016-10-20 NOTE — Telephone Encounter (Signed)
Rx refill granted. She is due for UDS.  She will need to give at pickup.  Must pickup here at the office.

## 2016-10-23 NOTE — Telephone Encounter (Signed)
Patient informed of message below.

## 2016-10-30 ENCOUNTER — Encounter: Payer: Self-pay | Admitting: Physician Assistant

## 2016-10-30 ENCOUNTER — Ambulatory Visit (INDEPENDENT_AMBULATORY_CARE_PROVIDER_SITE_OTHER): Payer: Commercial Managed Care - PPO | Admitting: Physician Assistant

## 2016-10-30 ENCOUNTER — Encounter: Payer: Self-pay | Admitting: Emergency Medicine

## 2016-10-30 VITALS — BP 102/60 | HR 79 | Temp 97.7°F | Resp 14 | Ht 66.0 in | Wt 121.0 lb

## 2016-10-30 DIAGNOSIS — L700 Acne vulgaris: Secondary | ICD-10-CM

## 2016-10-30 DIAGNOSIS — F988 Other specified behavioral and emotional disorders with onset usually occurring in childhood and adolescence: Secondary | ICD-10-CM | POA: Diagnosis not present

## 2016-10-30 MED ORDER — DEXMETHYLPHENIDATE HCL ER 40 MG PO CP24: 40.0000 mg | ORAL_CAPSULE | Freq: Every day | ORAL | 0 refills | Status: DC

## 2016-10-30 NOTE — Progress Notes (Signed)
Dictation #1 [id number]  [id number]   Patient presents to clinic today for follow-up of ADD and to discuss acute concerns.   ADD -- Pt is currently taking Dexmethylphenidate to help with minimizing distractions during school. Pt states that she is not taking the medication daily, but as needed on school-intensive days. Pt reports that she is able to stay focused with the medication and is currently making good grades. On a typical week, pt states she takes medication Monday-Thursday and occasionally on the weekend if she will be studying a lot. Pt denies any side effects of medication.  Acne -- Patient endorses using erythromycin gel every night before bed. Pt states that gel actually irritated one area. Pt has tried benzoyl peroxide in the past with no help. Pt states that acne flares up with stress and menstrual period.   History reviewed. No pertinent past medical history.  Current Outpatient Prescriptions on File Prior to Visit  Medication Sig Dispense Refill  . erythromycin with ethanol (EMGEL) 2 % gel Apply topically daily. 30 g 0   No current facility-administered medications on file prior to visit.     Allergies  Allergen Reactions  . Adderall [Amphetamine-Dextroamphetamine] Hives    Also with insomnia and weight loss  . Albuterol Sulfate     Seizures     Family History  Problem Relation Age of Onset  . Cancer Maternal Grandmother [id number]    breast  . Hypertension Maternal Grandfather   . Diabetes Maternal Grandfather   . Cancer Paternal Grandfather     throat cancer    Social History   Social History  . Marital status: Single    Spouse name: N/A  . Number of children: N/A  . Years of education: N/A   Social History Main Topics  . Smoking status: Never Smoker  . Smokeless tobacco: Never Used  . Alcohol use No  . Drug use: No  . Sexual activity: Not Asked   Other Topics Concern  . None   Social History Narrative  . None   Review of Systems - See HPI.   All other ROS are negative.  BP 102/60   Pulse 79   Temp 97.7 F (36.5 C) (Oral)   Resp 14   Ht 5\' 6"  (1.676 m)   Wt 121 lb (54.9 kg)   SpO2 99%   BMI 19.53 kg/m   Physical Exam  Constitutional: She is oriented to person, place, and time and well-developed, well-nourished, and in no distress.  HENT:  Head: Normocephalic and atraumatic.  Eyes: Conjunctivae are normal.  Neck: Neck supple.  Cardiovascular: Normal rate, regular rhythm, normal heart sounds and intact distal pulses.   Pulmonary/Chest: Effort normal and breath sounds normal. No respiratory distress. She has no wheezes. She has no rales. She exhibits no tenderness.  Abdominal: Soft. Bowel sounds are normal. She exhibits no distension and no mass. There is no tenderness. There is no rebound and no guarding.  Lymphadenopathy:    She has no cervical adenopathy.  Neurological: She is alert and oriented to person, place, and time.  Skin: Skin is warm and dry. No rash noted.  Psychiatric: Affect normal.  Vitals reviewed.  Assessment/Plan: Acne vulgaris Not responding well to current measures. Increase hydration. Referral to Dermatology placed.  ADD (attention deficit disorder) Doing very well with current regimen. CSC updated. FU 6 months.    Piedad ClimesMartin, Paige Monarrez Cody, PA-C

## 2016-10-30 NOTE — Patient Instructions (Signed)
Please continue medications as directed. You can stop the antibiotic ointment until your assessment with Dermatology. Stay well hydrated and continue the probiotics.  I encourage you to increase hydration and the amount of fiber in your diet.  Start a daily probiotic (Align, Culturelle, Digestive Advantage, etc.). If no bowel movement within 24 hours, take 2 Tbs of Milk of Magnesia in a 4 oz glass of warmed prune juice every 2-3 days to help promote bowel movement. If no results within 24 hours, then repeat above regimen, adding a Dulcolax stool softener to regimen. If this does not promote a bowel movement, please call the office.  Follow-up with me in 3 months.   Constipation, Adult Constipation is when a person:  Poops (has a bowel movement) fewer times in a week than normal.  Has a hard time pooping.  Has poop that is dry, hard, or bigger than normal. Follow these instructions at home: Eating and drinking    Eat foods that have a lot of fiber, such as:  Fresh fruits and vegetables.  Whole grains.  Beans.  Eat less of foods that are high in fat, low in fiber, or overly processed, such as:  [place] fries.  Hamburgers.  Cookies.  Candy.  Soda.  Drink enough fluid to keep your pee (urine) clear or pale yellow. General instructions   Exercise regularly or as told by your doctor.  Go to the restroom when you feel like you need to poop. Do not hold it in.  Take over-the-counter and prescription medicines only as told by your doctor. These include any fiber supplements.  Do pelvic floor retraining exercises, such as:  Doing deep breathing while relaxing your lower belly (abdomen).  Relaxing your pelvic floor while pooping.  Watch your condition for any changes.  Keep all follow-up visits as told by your doctor. This is important. Contact a doctor if:  You have pain that gets worse.  You have a fever.  You have not pooped for 4 days.  You throw up  (vomit).  You are not hungry.  You lose weight.  You are bleeding from the anus.  You have thin, pencil-like poop (stool). Get help right away if:  You have a fever, and your symptoms suddenly get worse.  You leak poop or have blood in your poop.  Your belly feels hard or bigger than normal (is bloated).  You have very bad belly pain.  You feel dizzy or you faint. This information is not intended to replace advice given to you by your health care provider. Make sure you discuss any questions you have with your health care provider. Document Released: 01/31/2008 Document Revised: 03/03/2016 Document Reviewed: 02/02/2016 Elsevier Interactive Patient Education  2017 [employer]

## 2016-10-30 NOTE — Progress Notes (Signed)
Pre visit review using our clinic review tool, if applicable. No additional management support is needed unless otherwise documented below in the visit note. 

## 2016-10-31 NOTE — Assessment & Plan Note (Signed)
Doing very well with current regimen. CSC updated. FU 6 months.

## 2016-10-31 NOTE — Assessment & Plan Note (Signed)
Not responding well to current measures. Increase hydration. Referral to Dermatology placed.

## 2016-11-27 ENCOUNTER — Ambulatory Visit: Payer: Commercial Managed Care - PPO | Admitting: Physician Assistant

## 2017-01-26 ENCOUNTER — Encounter: Payer: Self-pay | Admitting: Emergency Medicine

## 2017-01-26 ENCOUNTER — Ambulatory Visit (INDEPENDENT_AMBULATORY_CARE_PROVIDER_SITE_OTHER): Payer: Commercial Managed Care - PPO | Admitting: Physician Assistant

## 2017-01-26 ENCOUNTER — Ambulatory Visit: Payer: Commercial Managed Care - PPO | Admitting: Physician Assistant

## 2017-01-26 ENCOUNTER — Encounter: Payer: Self-pay | Admitting: Physician Assistant

## 2017-01-26 VITALS — BP 100/60 | HR 70 | Temp 98.7°F | Resp 14 | Ht 66.0 in | Wt 123.0 lb

## 2017-01-26 DIAGNOSIS — Z0184 Encounter for antibody response examination: Secondary | ICD-10-CM | POA: Diagnosis not present

## 2017-01-26 DIAGNOSIS — F988 Other specified behavioral and emotional disorders with onset usually occurring in childhood and adolescence: Secondary | ICD-10-CM | POA: Diagnosis not present

## 2017-01-26 LAB — BASIC METABOLIC PANEL
BUN: 10 mg/dL (ref 6–23)
CALCIUM: 9.6 mg/dL (ref 8.4–10.5)
CO2: 27 mEq/L (ref 19–32)
Chloride: 107 mEq/L (ref 96–112)
Creatinine, Ser: 0.76 mg/dL (ref 0.40–1.20)
GFR: 101.27 mL/min (ref >60.00)
GLUCOSE: 90 mg/dL (ref 70–99)
Potassium: 5.1 mEq/L (ref 3.5–5.1)
SODIUM: 140 meq/L (ref 135–145)

## 2017-01-26 LAB — HEPATITIS B SURFACE ANTIBODY, QUANTITATIVE: Hepatitis B-Post: 933 m[IU]/mL

## 2017-01-26 MED ORDER — DEXMETHYLPHENIDATE HCL ER 40 MG PO CP24: 40.0000 mg | ORAL_CAPSULE | Freq: Every day | ORAL | 0 refills | Status: DC

## 2017-01-26 NOTE — Patient Instructions (Addendum)
Please go to the lab for blood work. We will call you with your results.   Continue medications as directed. Follow-up 6 months.

## 2017-01-26 NOTE — Progress Notes (Signed)
Pre visit review using our clinic review tool, if applicable. No additional management support is needed unless otherwise documented below in the visit note. 

## 2017-01-26 NOTE — Assessment & Plan Note (Signed)
Hep B Surface Ab Quantitative titer drawn.

## 2017-01-26 NOTE — Progress Notes (Signed)
   Patient presents to clinic today for follow-up of ADD. Is currently on a regimen of dexmethylphenidate 40 mg daily. Is taking as directed. Still doing very well in school. Is sleeping and eating well. Denies side effect on medication. Only takes on weekdays.  Patient is in need of Hep B titers for school. Would like drawn today. Also requesting BMP.   History reviewed. No pertinent past medical history.  No current outpatient prescriptions on file prior to visit.   No current facility-administered medications on file prior to visit.     Allergies  Allergen Reactions  . Adderall [Amphetamine-Dextroamphetamine] Hives    Also with insomnia and weight loss  . Albuterol Sulfate     Seizures     Family History  Problem Relation Age of Onset  . Cancer Maternal Grandmother [id number]       breast  . Hypertension Maternal Grandfather   . Diabetes Maternal Grandfather   . Cancer Paternal Grandfather        throat cancer    Social History   Social History  . Marital status: Single    Spouse name: N/A  . Number of children: N/A  . Years of education: N/A   Social History Main Topics  . Smoking status: Never Smoker  . Smokeless tobacco: Never Used  . Alcohol use No  . Drug use: No  . Sexual activity: Not Asked   Other Topics Concern  . None   Social History Narrative  . None   Review of Systems - See HPI.  All other ROS are negative.  BP 100/60   Pulse 70   Temp 98.7 F (37.1 C) (Oral)   Resp 14   Ht 5\' 6"  (1.676 m)   Wt 123 lb (55.8 kg)   SpO2 99%   BMI 19.85 kg/m   Physical Exam  Constitutional: She is oriented to person, place, and time and well-developed, well-nourished, and in no distress.  HENT:  Head: Normocephalic and atraumatic.  Eyes: Conjunctivae are normal.  Cardiovascular: Normal rate, regular rhythm, normal heart sounds and intact distal pulses.   Pulmonary/Chest: Effort normal and breath sounds normal. No respiratory distress. She has no wheezes.  She has no rales. She exhibits no tenderness.  Neurological: She is alert and oriented to person, place, and time.  Skin: Skin is warm and dry. No rash noted.  Psychiatric: Affect normal.  Vitals reviewed.  Assessment/Plan: Immunity status testing Hep B Surface Ab Quantitative titer drawn.  ADD (attention deficit disorder) Doing very well. Vitals stable. Continue current regimen.    Piedad ClimesMartin, Bookert Guzzi Cody, PA-C

## 2017-01-26 NOTE — Assessment & Plan Note (Signed)
Doing very well. Vitals stable. Continue current regimen.

## 2017-01-29 ENCOUNTER — Telehealth: Payer: Self-pay | Admitting: Emergency Medicine

## 2017-01-29 NOTE — Telephone Encounter (Signed)
Patient called back for her lab results. Advised she is immune to Hep B and her glucose level is normal at 90. Patient wanted lab results mailed to her. Address given locally. [address] [place] [id number]. Placed in the mail today

## 2017-04-20 ENCOUNTER — Other Ambulatory Visit: Payer: Self-pay | Admitting: *Deleted

## 2017-04-20 MED ORDER — DEXMETHYLPHENIDATE HCL ER 40 MG PO CP24: 40.0000 mg | ORAL_CAPSULE | Freq: Every day | ORAL | 0 refills | Status: DC

## 2017-04-20 NOTE — Telephone Encounter (Signed)
Dexmethylphenidate last rx 01/26/17 #30 CSC:10/30/16 UDS:11/26/15 low risk  Follow in 6 months.  Please advise

## 2017-04-20 NOTE — Telephone Encounter (Signed)
Patient is asking for a refill on Dexmethylphenidate.  She does have enough to get her through the weekend, so she would like to pick this up sometime next week.

## 2017-04-20 NOTE — Telephone Encounter (Signed)
Advised patient that her rx is ready for pick up at the front desk. She is agreeable and will pick up on Monday

## 2017-09-04 ENCOUNTER — Telehealth: Payer: Self-pay | Admitting: Physician Assistant

## 2017-09-04 NOTE — Telephone Encounter (Signed)
Pt asking if she could be called in a refill of Dexmethylphenidate until seen by new PCP on 10/25/17.

## 2017-09-04 NOTE — Telephone Encounter (Signed)
Copied from CRM [id number]. Topic: Quick Communication - Rx Refill/Question >> Sep 04, 2017  4:37 PM Clack, Princella PellegriniJessica D wrote: Has the patient contacted their pharmacy? No.   (Agent: If no, request that the patient contact the pharmacy for the refill.)   Preferred Pharmacy (with phone number or street name): [id number]- [place], [place] - [address] [phone] (Phone) [phone] (Fax)  Pt would like to know if Daphine DeutscherMartin could call her in enough refills until she is able to see her new PCP, 10/25/17. Dexmethylphenidate HCl 40 MG CP24 [[id number]][[id number]]    Agent: Please be advised that RX refills may take up to 3 business days. We ask that you follow-up with your pharmacy.

## 2017-09-05 MED ORDER — DEXMETHYLPHENIDATE HCL ER 40 MG PO CP24: 40.0000 mg | ORAL_CAPSULE | Freq: Every day | ORAL | 0 refills | Status: DC

## 2017-09-05 NOTE — Telephone Encounter (Signed)
I will send in an Rx for January and February to her pharmacy.

## 2017-09-05 NOTE — Telephone Encounter (Signed)
Patient states that due to her mother's insurance she is having to change PCP's. Asking for the medication below to get her through until she sees the new doctor on 2/28

## 2018-01-27 ENCOUNTER — Ambulatory Visit (INDEPENDENT_AMBULATORY_CARE_PROVIDER_SITE_OTHER): Payer: Worker's Compensation

## 2018-01-27 ENCOUNTER — Ambulatory Visit (HOSPITAL_COMMUNITY)
Admission: EM | Admit: 2018-01-27 | Discharge: 2018-01-27 | Disposition: A | Discharge status: Home / Self Care | Payer: Worker's Compensation

## 2018-01-27 ENCOUNTER — Encounter (HOSPITAL_COMMUNITY): Payer: Self-pay | Admitting: Emergency Medicine

## 2018-01-27 DIAGNOSIS — S93401A Sprain of unspecified ligament of right ankle, initial encounter: Secondary | ICD-10-CM | POA: Diagnosis not present

## 2018-01-27 NOTE — ED Triage Notes (Signed)
Pt states at work a metal pallet rolled over her R foot and ankle.

## 2018-01-27 NOTE — Discharge Instructions (Signed)
Use anti-inflammatories for pain/swelling. You may take up to 800 mg Ibuprofen every 8 hours with food. You may supplement Ibuprofen with Tylenol [phone] mg every 8 hours.   Ice  Use Ace wrap for compression to help with swelling  Slowly transition from crutches back to full weightbearing over the next week

## 2018-01-28 NOTE — ED Provider Notes (Signed)
[facility]    CSN: [id number] Arrival date & time: 01/27/18  1423     History   Chief Complaint Chief Complaint  Patient presents with  . Ankle Pain    HPI Jeanette Moreno is a 22 y.o. female no contributing past medical history presenting today for evaluation of right foot and ankle pain.  Patient states that she was working at [employer] and pulling something behind her, the metal palate ended up going on top of the posterior aspect of her foot/ankle along the Achilles.  Denies feeling any popping sensation.  Accident happened today.  Has been weightbearing on her toe/ball of foot, avoiding pressure to heel.   HPI  History reviewed. No pertinent past medical history.  Patient Active Problem List   Diagnosis Date Noted  . Immunity status testing 01/26/2017  . Visit for preventive health examination 03/14/2016  . Acne vulgaris 05/25/2015  . ADD (attention deficit disorder) 03/12/2014    History reviewed. No pertinent surgical history.  OB History   None      Home Medications    Prior to Admission medications   Medication Sig Start Date End Date Taking? Authorizing Provider  AMOXICILLIN PO Take by mouth.   Yes [provider]  Dexmethylphenidate HCl 40 MG CP24 Take 1 capsule (40 mg total) by mouth daily. PATIENT TAKING MONDAY - FRIDAY 09/05/17   Waldon MerlMartin, William C, PA-C    Family History Family History  Problem Relation Age of Onset  . Cancer Maternal Grandmother 6649       breast  . Hypertension Maternal Grandfather   . Diabetes Maternal Grandfather   . Cancer Paternal Grandfather        throat cancer    Social History Social History   Tobacco Use  . Smoking status: Never Smoker  . Smokeless tobacco: Never Used  Substance Use Topics  . Alcohol use: No  . Drug use: No     Allergies   Adderall [amphetamine-dextroamphetamine] and Albuterol sulfate   Review of Systems Review of Systems  Constitutional: Negative for fatigue and fever.    Respiratory: Negative for shortness of breath.   Cardiovascular: Negative for chest pain.  Gastrointestinal: Negative for nausea and vomiting.  Musculoskeletal: Positive for arthralgias, gait problem, joint swelling and myalgias.  Skin: Positive for color change. Negative for wound.  Neurological: Negative for dizziness, weakness, light-headedness, numbness and headaches.     Physical Exam Triage Vital Signs ED Triage Vitals [01/27/18 1522]  Enc Vitals Group     BP 109/70     Pulse Rate 74     Resp 18     Temp 98.6 F (37 C)     Temp src      SpO2 100 %     Weight      Height      Head Circumference      Peak Flow      Pain Score      Pain Loc      Pain Edu?      Excl. in GC?    No data found.  Updated Vital Signs BP 109/70   Pulse 74   Temp 98.6 F (37 C)   Resp 18   LMP 01/20/2018   SpO2 100%   Visual Acuity Right Eye Distance:   Left Eye Distance:   Bilateral Distance:    Right Eye Near:   Left Eye Near:    Bilateral Near:     Physical Exam  Constitutional:  She is oriented to person, place, and time. She appears well-developed and well-nourished. No distress.  HENT:  Head: Normocephalic and atraumatic.  Eyes: Conjunctivae are normal.  Neck: Neck supple.  Cardiovascular: Normal rate.  Pulmonary/Chest: Effort normal. No respiratory distress.  Musculoskeletal: She exhibits no edema.  Mild swelling and erythema to posterior aspect of ankle over Achilles as well as lateral malleolus.  Tenderness to palpation over this area as well.  Nontender to palpation diffusely across first through fifth metatarsals as well as over the anterior navicular.  Dorsalis pedis 2+, cap refill less than 2 seconds.  Negative Thompson bilaterally  Neurological: She is alert and oriented to person, place, and time.  Skin: Skin is warm and dry.  Psychiatric: She has a normal mood and affect.  Nursing note and vitals reviewed.    UC Treatments / Results  Labs (all labs  ordered are listed, but only abnormal results are displayed) Labs Reviewed - No data to display  EKG None  Radiology Dg Ankle Complete Right  Result Date: 01/27/2018 CLINICAL DATA:  Right foot and ankle pain since a pallet ran over the patient's foot today. Initial encounter. EXAM: RIGHT ANKLE - COMPLETE 3+ VIEW COMPARISON:  None. FINDINGS: There is no evidence of fracture, dislocation, or joint effusion. There is no evidence of arthropathy or other focal bone abnormality. Soft tissues are unremarkable. IMPRESSION: Negative exam. Electronically Signed   By: Drusilla Kanner M.D.   On: 01/27/2018 15:54   Dg Foot Complete Right  Result Date: 01/27/2018 CLINICAL DATA:  Right foot and ankle pain since a pallet ran over the patient's foot today. Initial encounter. EXAM: RIGHT FOOT COMPLETE - 3+ VIEW COMPARISON:  None. FINDINGS: There is no evidence of fracture or dislocation. There is no evidence of arthropathy or other focal bone abnormality. Soft tissues are unremarkable. IMPRESSION: Negative exam. Electronically Signed   By: Drusilla Kanner M.D.   On: 01/27/2018 15:54    Procedures Procedures (including critical care time)  Medications Ordered in UC Medications - No data to display  Initial Impression / Assessment and Plan / UC Course  I have reviewed the triage vital signs and the nursing notes.  Pertinent labs & imaging results that were available during my care of the patient were reviewed by me and considered in my medical decision making (see chart for details).     X-rays unremarkable for acute pathology.  Likely sprain of the right ankle/Achilles strain.  Provided Ace wrap to apply compression to help with pain and swelling.  Advised NSAIDs for pain.  Patient requesting crutches, advised to slowly transition back to full weightbearing as pain improving.  Discussed following up with orthopedics if symptoms not improving, given location/mechanism of injury could also worry about  Achilles tendinopathy.  Worker's Comp. papers filled out.Discussed strict return precautions. Patient verbalized understanding and is agreeable with plan.  Final Clinical Impressions(s) / UC Diagnoses   Final diagnoses:  Sprain of right ankle, unspecified ligament, initial encounter     Discharge Instructions     Use anti-inflammatories for pain/swelling. You may take up to 800 mg Ibuprofen every 8 hours with food. You may supplement Ibuprofen with Tylenol [phone] mg every 8 hours.   Ice  Use Ace wrap for compression to help with swelling  Slowly transition from crutches back to full weightbearing over the next week   ED Prescriptions    None     Controlled Substance Prescriptions [place] Controlled Substance Registry consulted? Not Applicable  Lew Dawes, [place] 01/28/18 [phone]

## 2018-05-28 ENCOUNTER — Other Ambulatory Visit (HOSPITAL_COMMUNITY)
Admission: RE | Admit: 2018-05-28 | Discharge: 2018-05-28 | Disposition: A | Discharge status: Home / Self Care | Payer: PRIVATE HEALTH INSURANCE | Source: Ambulatory Visit | Attending: Physician Assistant | Admitting: Physician Assistant

## 2018-05-28 ENCOUNTER — Other Ambulatory Visit: Payer: Self-pay

## 2018-05-28 ENCOUNTER — Ambulatory Visit: Payer: Self-pay | Admitting: Physician Assistant

## 2018-05-28 ENCOUNTER — Encounter: Payer: Self-pay | Admitting: Physician Assistant

## 2018-05-28 ENCOUNTER — Ambulatory Visit: Payer: PRIVATE HEALTH INSURANCE | Admitting: Physician Assistant

## 2018-05-28 VITALS — BP 108/62 | HR 69 | Temp 98.6°F | Resp 14 | Ht 66.0 in | Wt 127.0 lb

## 2018-05-28 DIAGNOSIS — F988 Other specified behavioral and emotional disorders with onset usually occurring in childhood and adolescence: Secondary | ICD-10-CM | POA: Diagnosis not present

## 2018-05-28 DIAGNOSIS — Z113 Encounter for screening for infections with a predominantly sexual mode of transmission: Secondary | ICD-10-CM

## 2018-05-28 DIAGNOSIS — Z789 Other specified health status: Secondary | ICD-10-CM | POA: Diagnosis not present

## 2018-05-28 LAB — POCT URINE PREGNANCY: Preg Test, Ur: NEGATIVE

## 2018-05-28 MED ORDER — DEXMETHYLPHENIDATE HCL ER 40 MG PO CP24: 40.0000 mg | ORAL_CAPSULE | Freq: Every day | ORAL | 0 refills | Status: DC

## 2018-05-28 NOTE — Patient Instructions (Signed)
Please go to the lab today for blood work.  I will call you with your results. We will alter treatment regimen(s) if indicated by your results.   Your urine pregnancy is negative. Keep an eye on your menstrual period since you are expecting to start within the next week..  Please schedule PAP exam.

## 2018-05-28 NOTE — Progress Notes (Signed)
Patient presents to clinic today requesting pregnancy testing and STI testing. Notes that she was recently sexually active with one female partner who she was in a relationship with. Did use protection. Is concerned that it may have broken. Has taken home pregnancy testing that was negative. Is not due for her menstrual period until next week. Denies any vaginal symptoms. States this significant other has stopped speaking to her since the encounter and she wants to make sure she did not get anything.   History reviewed. No pertinent past medical history.  Current Outpatient Medications on File Prior to Visit  Medication Sig Dispense Refill  . Dexmethylphenidate HCl 40 MG CP24 Take 1 tablet by mouth daily.     No current facility-administered medications on file prior to visit.     Allergies  Allergen Reactions  . Adderall [Amphetamine-Dextroamphetamine] Hives    Also with insomnia and weight loss  . Albuterol Sulfate     Seizures     Family History  Problem Relation Age of Onset  . Cancer Maternal Grandmother 72       breast  . Hypertension Maternal Grandfather   . Diabetes Maternal Grandfather   . Cancer Paternal Grandfather        throat cancer    Social History   Socioeconomic History  . Marital status: Single    Spouse name: Not on file  . Number of children: Not on file  . Years of education: Not on file  . Highest education level: Not on file  Occupational History  . Not on file  Social Needs  . Financial resource strain: Not on file  . Food insecurity:    Worry: Not on file    Inability: Not on file  . Transportation needs:    Medical: Not on file    Non-medical: Not on file  Tobacco Use  . Smoking status: Never Smoker  . Smokeless tobacco: Never Used  Substance and Sexual Activity  . Alcohol use: No  . Drug use: No  . Sexual activity: Not on file  Lifestyle  . Physical activity:    Days per week: Not on file    Minutes per session: Not on file  .  Stress: Not on file  Relationships  . Social connections:    Talks on phone: Not on file    Gets together: Not on file    Attends religious service: Not on file    Active member of club or organization: Not on file    Attends meetings of clubs or organizations: Not on file    Relationship status: Not on file  Other Topics Concern  . Not on file  Social History Narrative  . Not on file    Review of Systems - See HPI.  All other ROS are negative.  BP 108/62   Pulse 69   Temp 98.6 F (37 C) (Oral)   Resp 14   Ht 5\' 6"  (1.676 m)   Wt 127 lb (57.6 kg)   SpO2 99%   BMI 20.50 kg/m   Physical Exam  Constitutional: She appears well-developed and well-nourished.  HENT:  Head: Normocephalic and atraumatic.  Cardiovascular: Normal rate, regular rhythm, normal heart sounds and intact distal pulses.  Pulmonary/Chest: Effort normal.  Psychiatric: She has a normal mood and affect.  Vitals reviewed.  Assessment/Plan: 1. Always uses condoms for sexual activity Potential condom break. Urine pregnancy negative. Due for period around 06/03/18. She is to contact us if this is not  on schedule.  - POCT urine pregnancy  2. Attention deficit disorder (ADD) without hyperactivity Medication refilled.  3. Routine screening for STI (sexually transmitted infection) Asymptomatic. Will check lab panel today. Safe sex practices reviewed.  - Urine cytology ancillary only - HIV Antibody (routine testing w rflx) - [facility]   Piedad Climes, PA-C

## 2018-05-29 ENCOUNTER — Ambulatory Visit: Payer: PRIVATE HEALTH INSURANCE | Admitting: Physician Assistant

## 2018-05-29 LAB — URINE CYTOLOGY ANCILLARY ONLY
CHLAMYDIA, DNA PROBE: NEGATIVE
NEISSERIA GONORRHEA: NEGATIVE
Trichomonas: NEGATIVE

## 2018-05-30 LAB — HIV ANTIBODY (ROUTINE TESTING W REFLEX): HIV: NONREACTIVE

## 2018-05-30 LAB — RPR: RPR: NONREACTIVE

## 2018-05-31 LAB — URINE CYTOLOGY ANCILLARY ONLY
BACTERIAL VAGINITIS: NEGATIVE
Candida vaginitis: NEGATIVE

## 2018-06-11 ENCOUNTER — Ambulatory Visit: Payer: PRIVATE HEALTH INSURANCE | Admitting: Family Medicine

## 2018-06-11 ENCOUNTER — Other Ambulatory Visit: Payer: Self-pay

## 2018-06-11 ENCOUNTER — Encounter: Payer: Self-pay | Admitting: Family Medicine

## 2018-06-11 VITALS — BP 100/70 | HR 85 | Temp 98.7°F | Ht 66.0 in | Wt 122.0 lb

## 2018-06-11 DIAGNOSIS — L700 Acne vulgaris: Secondary | ICD-10-CM

## 2018-06-11 DIAGNOSIS — Z30011 Encounter for initial prescription of contraceptive pills: Secondary | ICD-10-CM

## 2018-06-11 MED ORDER — NORETHINDRONE-ETH ESTRADIOL 1-35 MG-MCG PO TABS: 1.0000 | ORAL_TABLET | Freq: Every day | ORAL | 11 refills | Status: DC

## 2018-06-11 NOTE — Progress Notes (Signed)
Subjective  CC:  Chief Complaint  Patient presents with  . Contraception    Patient wants to discuss birth control pill options     HPI: Jeanette Moreno is a 22 y.o. female who presents to the office today to address the problems listed above in the chief complaint.  Very pleasant 22 year old patient presents for contraceptive counseling.  She is monogamous, healthy relationship, uses condoms.  LMP was about 2 weeks ago.  Last note from early October was for a pregnancy scare.  She does not want to be pregnant.  She is never used birth control in the past.  She also suffers from irregular menses and dysmenorrhea.  She has acne.  For these reasons she thinks well contraceptives would be best.  She has no problems taking medications daily.  She does this for her ADD.  No contraindications.  She has a distant family history of breast cancer in her maternal grandmother at an elderly age.  No family history of clotting disorders.  Patient has no liver disease.  She is due for a Pap smear. Assessment  1. Encounter for initial prescription of contraceptive pills   2. Acne vulgaris      Plan   Contraception: Counseling done.  Start birth control pills.  Educated on appropriate use, expectations and management strategies.  See AVS.  Ortho-Novum due to heavy periods.  Follow-up 3 months  Patient to schedule complete physical with Pap smear with her PCP.  Follow up: Return in about 3 months (around 09/11/2018) for follow up on birth control.   No orders of the defined types were placed in this encounter.  Meds ordered this encounter  Medications  . norethindrone-ethinyl estradiol 1/35 (ORTHO-NOVUM 1/35, 28,) tablet    Sig: Take 1 tablet by mouth daily.    Dispense:  1 Package    Refill:  11      I reviewed the patients updated PMH, FH, and SocHx.    Patient Active Problem List   Diagnosis Date Noted  . Immunity status testing 01/26/2017  . Visit for preventive health examination  03/14/2016  . Acne vulgaris 05/25/2015  . ADD (attention deficit disorder) 03/12/2014   Current Meds  Medication Sig  . Dexmethylphenidate HCl 40 MG CP24 Take 1 capsule (40 mg total) by mouth daily.    Allergies: Patient is allergic to adderall [amphetamine-dextroamphetamine] and albuterol sulfate. Family History: Patient family history includes Cancer in her paternal grandfather; Cancer (age of onset: 59) in her maternal grandmother; Diabetes in her maternal grandfather; Hypertension in her maternal grandfather. Social History:  Patient  reports that she has never smoked. She has never used smokeless tobacco. She reports that she does not drink alcohol or use drugs.  Review of Systems: Constitutional: Negative for fever malaise or anorexia Cardiovascular: negative for chest pain Respiratory: negative for SOB or persistent cough Gastrointestinal: negative for abdominal pain  Objective  Vitals: BP 100/70   Pulse 85   Temp 98.7 F (37.1 C)   Ht 5\' 6"  (1.676 m)   Wt 122 lb (55.3 kg)   SpO2 98%   BMI 19.69 kg/m  General: no acute distress , A&Ox3 HEENT: PEERL, conjunctiva normal, Oropharynx moist,neck is supple Cardiovascular:  RRR without murmur or gallop.  Respiratory:  Good breath sounds bilaterally, CTAB with normal respiratory effort Skin:  Warm, no rashes, facial acne nonpustular     Commons side effects, risks, benefits, and alternatives for medications and treatment plan prescribed today were discussed, and the  patient expressed understanding of the given instructions. Patient is instructed to call or message via MyChart if he/she has any questions or concerns regarding our treatment plan. No barriers to understanding were identified. We discussed Red Flag symptoms and signs in detail. Patient expressed understanding regarding what to do in case of urgent or emergency type symptoms.   Medication list was reconciled, printed and provided to the patient in AVS. Patient  instructions and summary information was reviewed with the patient as documented in the AVS. This note was prepared with assistance of Dragon voice recognition software. Occasional wrong-word or sound-a-like substitutions may have occurred due to the inherent limitations of voice recognition software

## 2018-06-11 NOTE — Patient Instructions (Signed)
Please return in 3 months to follow up on birth control.  If you have any questions or concerns, please don't hesitate to send me a message via MyChart or call the office at [phone]. Thank you for visiting with Jeanette Moreno today! It's our pleasure caring for you.  Starting Your Oral Contraception:  1) First Day Start - Take your first pill during the first 24 hours of your menstrual cycle. No back-up contraceptivemethod is needed when the pill is started the first day of your menses.  Recommended option: 2) Sunday Start - Wait until the first Sunday after your menstrual cycle begins to take your first pill. With this optionuse another method of birth control for the first 14 days of the first cycle only.  3) "Quick Start" - Start the pill today. If you have had unprotected sexual intercourse since your last period, perform a pregnancy test prior to starting the pill. If it is negative, start the pill today. Use another method of birth control such as condoms or spermicide the first seven days of the first cycle of use.   Oral Contraception Answers to Common Questions.   1)  Take your birth control pill at the same time every day. This ensures that a constant hormone level is maintained at all times.  2) Should you experience nausea or vomiting, try taking your pill after a meal or at bedtime.  3)  Irregular vaginal bleeding or spotting is common while starting the pill, especially during the first few months of oral contraceptive use. It should resolve by 6 months.   4) Birth control pills do not protect you from sexually transmitted infections.   INSTRUCTIONS FOR MISSED PILLS: 1 active pill < 24 hours late in any week: Take 1 active pill ASAP* and continue pack as usual.  Missed 1 active pills (i.e., >24 hours late):  Take 1 active pill ASAP* and continue pack as usual, (take 2nd pill that day) Back-up contraception for 7 days. Consider Emergency Contraception (EC) if unprotected  intercourse occurred within the  5 days prior to missing pill.  If missed more than 2 pills, throw away pack and start new pack on Sunday; Take a pregnancy test prior to starting new pack.    MEDICATIONS AND BIRTH CONTROL PILLS: The following medications and supplements may interfere with the effectiveness of CHC:  some antibiotics  anticonvulsants  St. John's Wort  Provigil  It is recommend that if you take the above medications and supplements and are sexually active with a female partner, you use a back-up method of birth control while using the medication and for 7 consecutive days once the medication is completed.   IF YOU EXPERIENCE ANY OF THE FOLLOWING, PLEASE CALL IMMEDIATELY OR GO TO THE EMERGENCY ROOM:   severe abdominal pain or tenderness in the lower abdomen  chest pain, sharp, sudden shortness of breath or coughing up blood  headache, severe and sudden, or vomiting, dizziness or faintness  eyesight problems, such as sudden blurred or doubled vision or flashes of light  severe pain or swelling in calf or groin   Oral Contraception Use Oral contraceptive pills (OCPs) are medicines taken to prevent pregnancy. OCPs work by preventing the ovaries from releasing eggs. The hormones in OCPs also cause the cervical mucus to thicken, preventing the sperm from entering the uterus. The hormones also cause the uterine lining to become thin, not allowing a fertilized egg to attach to the inside of the uterus. OCPs are highly effective when  taken exactly as prescribed. However, OCPs do not prevent sexually transmitted diseases (STDs). Safe sex practices, such as using condoms along with an OCP, can help prevent STDs. Before taking OCPs, you may have a physical exam and Pap test. Your health care provider may also order blood tests if necessary. Your health care provider will make sure you are a good candidate for oral contraception. Discuss with your health care provider the possible side  effects of the OCP you may be prescribed. When starting an OCP, it can take 2 to 3 months for the body to adjust to the changes in hormone levels in your body. How to take oral contraceptive pills Your health care provider may advise you on how to start taking the first cycle of OCPs. Otherwise, you can:  Start on day 1 of your menstrual period. You will not need any backup contraceptive protection with this start time.  Start on the first Sunday after your menstrual period or the day you get your prescription. In these cases, you will need to use backup contraceptive protection for the first week.  Start the pill at any time of your cycle. If you take the pill within 5 days of the start of your period, you are protected against pregnancy right away. In this case, you will not need a backup form of birth control. If you start at any other time of your menstrual cycle, you will need to use another form of birth control for 7 days. If your OCP is the type called a minipill, it will protect you from pregnancy after taking it for 2 days (48 hours).  After you have started taking OCPs:  If you forget to take 1 pill, take it as soon as you remember. Take the next pill at the regular time.  If you miss 2 or more pills, call your health care provider because different pills have different instructions for missed doses. Use backup birth control until your next menstrual period starts.  If you use a 28-day pack that contains inactive pills and you miss 1 of the last 7 pills (pills with no hormones), it will not matter. Throw away the rest of the non-hormone pills and start a new pill pack.  No matter which day you start the OCP, you will always start a new pack on that same day of the week. Have an extra pack of OCPs and a backup contraceptive method available in case you miss some pills or lose your OCP pack. Follow these instructions at home:  Do not smoke.  Always use a condom to protect against  STDs. OCPs do not protect against STDs.  Use a calendar to mark your menstrual period days.  Read the information and directions that came with your OCP. Talk to your health care provider if you have questions. Contact a health care provider if:  You develop nausea and vomiting.  You have abnormal vaginal discharge or bleeding.  You develop a rash.  You miss your menstrual period.  You are losing your hair.  You need treatment for mood swings or depression.  You get dizzy when taking the OCP.  You develop acne from taking the OCP.  You become pregnant. Get help right away if:  You develop chest pain.  You develop shortness of breath.  You have an uncontrolled or severe headache.  You develop numbness or slurred speech.  You develop visual problems.  You develop pain, redness, and swelling in the legs. This information is  not intended to replace advice given to you by your health care provider. Make sure you discuss any questions you have with your health care provider. Document Released: 08/03/2011 Document Revised: 01/20/2016 Document Reviewed: 02/02/2013 Elsevier Interactive Patient Education  2017 [employer].

## 2018-06-24 ENCOUNTER — Ambulatory Visit: Payer: Self-pay | Admitting: Physician Assistant

## 2018-07-12 ENCOUNTER — Other Ambulatory Visit: Payer: Self-pay | Admitting: Physician Assistant

## 2018-07-12 NOTE — Telephone Encounter (Signed)
Copied from CRM [id number]. Topic: Quick Communication - See Telephone Encounter >> Jul 12, 2018  3:07 PM Windy KalataMichael, Vada Swift L, NT wrote: CRM for notification. See Telephone encounter for: 07/12/18.  Patient is calling and requesting a refill on Dexmethylphenidate HCl 40 MG Cp24. Please advise.  [id number] - [place], [place] - [address] AT [address]OSS [address] [place] [id number] Phone: [phone] Fax: [phone]

## 2018-07-12 NOTE — Telephone Encounter (Signed)
Requested medication (s) are due for refill today: yes  Requested medication (s) are on the active medication list: yes  Last refill:  05/28/18 #30  Future visit scheduled: yes  Notes to clinic:  Unable to fill per protocol. LOV on 06/11/18    Requested Prescriptions  Pending Prescriptions Disp Refills   Dexmethylphenidate HCl 40 MG CP24 30 capsule 0    Sig: Take 1 capsule (40 mg total) by mouth daily.     Not Delegated - Psychiatry:  Stimulants/ADHD Failed - 07/12/2018  3:19 PM      Failed - This refill cannot be delegated      Failed - Urine Drug Screen completed in last 360 days.      Passed - Valid encounter within last 3 months    Recent Outpatient Visits          1 month ago Encounter for initial prescription of contraceptive pills   [facility], Malachi Bondsamille L, MD   1 month ago Always uses condoms for sexual activity   [facility], Kristine Garbe[place] C, [place]   1 year ago Attention deficit disorder (ADD) without hyperactivity   [facility] [place], Kristine GarbeWilliam C, [place]   1 year ago Attention deficit disorder (ADD) without hyperactivity   [facility] [place] Kristine Garbe[place] C, [place]   2 years ago Attention deficit disorder (ADD) without hyperactivity   [facility] [place], Kristine GarbeWilliam C, [place]      Future Appointments            In 1 month Waldon MerlMartin, William C, PA-C [facility] [place]

## 2018-07-15 MED ORDER — DEXMETHYLPHENIDATE HCL ER 40 MG PO CP24: 40.0000 mg | ORAL_CAPSULE | Freq: Every day | ORAL | 0 refills | Status: DC

## 2018-07-15 NOTE — Telephone Encounter (Signed)
Last OV 06/11/18 Dexmethylphenidate last filled 05/28/18 #30 with 0

## 2018-08-15 ENCOUNTER — Other Ambulatory Visit: Payer: Self-pay | Admitting: Physician Assistant

## 2018-08-16 MED ORDER — DEXMETHYLPHENIDATE HCL ER 40 MG PO CP24
40.0000 mg | ORAL_CAPSULE | Freq: Every day | ORAL | 0 refills | Status: DC
Start: 2018-08-16 — End: 2018-09-16

## 2018-08-16 NOTE — Telephone Encounter (Signed)
Last OV: 05/28/2018  Last Fill: 07/15/2018, #30 with 0 RF

## 2018-08-20 ENCOUNTER — Other Ambulatory Visit: Payer: Self-pay

## 2018-08-20 ENCOUNTER — Emergency Department (INDEPENDENT_AMBULATORY_CARE_PROVIDER_SITE_OTHER)
Admission: EM | Admit: 2018-08-20 | Discharge: 2018-08-20 | Disposition: A | Discharge status: Home / Self Care | Payer: PRIVATE HEALTH INSURANCE | Source: Home / Self Care | Attending: Family Medicine | Admitting: Family Medicine

## 2018-08-20 DIAGNOSIS — R300 Dysuria: Secondary | ICD-10-CM

## 2018-08-20 DIAGNOSIS — N3001 Acute cystitis with hematuria: Secondary | ICD-10-CM

## 2018-08-20 HISTORY — DX: Other specified behavioral and emotional disorders with onset usually occurring in childhood and adolescence: F98.8

## 2018-08-20 LAB — POCT URINALYSIS DIP (MANUAL ENTRY)
Bilirubin, UA: NEGATIVE
Glucose, UA: NEGATIVE mg/dL
Ketones, POC UA: NEGATIVE mg/dL
Nitrite, UA: NEGATIVE
Spec Grav, UA: 1.015 (ref 1.010–1.025)
Urobilinogen, UA: 0.2 E.U./dL
pH, UA: 7 (ref 5.0–8.0)

## 2018-08-20 MED ORDER — CEPHALEXIN 500 MG PO CAPS: 500.0000 mg | ORAL_CAPSULE | Freq: Two times a day (BID) | ORAL | 0 refills | Status: DC

## 2018-08-20 NOTE — ED Triage Notes (Signed)
Started last nigh with burning, frequency, and pain.  Took 800mg  ibuprofen

## 2018-08-20 NOTE — ED Provider Notes (Addendum)
[facility]    CSN: [id number] Arrival date & time: 08/20/18  1208     History   Chief Complaint Chief Complaint  Patient presents with  . Dysuria    HPI Jeanette Moreno is a 22 y.o. female.   HPI  Jeanette Moreno is a 22 y.o. female presenting to UC with c/o sudden onset dysuria with burning and frequency last night. Hematuria. Mild bladder pain. Denies fever, chills, n/vd/. Ibuprofen 800mg  at 4AM did help with discomfort. Denies concern for STDs.    Past Medical History:  Diagnosis Date  . ADD (attention deficit disorder)     Patient Active Problem List   Diagnosis Date Noted  . Immunity status testing 01/26/2017  . Visit for preventive health examination 03/14/2016  . Acne vulgaris 05/25/2015  . ADD (attention deficit disorder) 03/12/2014    No past surgical history on file.  OB History   No obstetric history on file.      Home Medications    Prior to Admission medications   Medication Sig Start Date End Date Taking? Authorizing Provider  cephALEXin (KEFLEX) 500 MG capsule Take 1 capsule (500 mg total) by mouth 2 (two) times daily. 08/20/18   Lurene ShadowPhelps, Kiala Faraj O, PA-C  Dexmethylphenidate HCl 40 MG CP24 Take 1 capsule (40 mg total) by mouth daily. 08/16/18   Waldon MerlMartin, William C, PA-C  norethindrone-ethinyl estradiol 1/35 (ORTHO-NOVUM 1/35, 28,) tablet Take 1 tablet by mouth daily. 06/11/18   Willow OraAndy, Camille L, MD    Family History Family History  Problem Relation Age of Onset  . Cancer Maternal Grandmother 2549       breast  . Hypertension Maternal Grandfather   . Diabetes Maternal Grandfather   . Cancer Paternal Grandfather        throat cancer    Social History Social History   Tobacco Use  . Smoking status: Never Smoker  . Smokeless tobacco: Never Used  Substance Use Topics  . Alcohol use: No  . Drug use: No     Allergies   Adderall [amphetamine-dextroamphetamine] and Albuterol sulfate   Review of Systems Review of Systems    Constitutional: Negative for chills and fever.  Gastrointestinal: Positive for abdominal pain. Negative for diarrhea, nausea and vomiting.  Genitourinary: Positive for dysuria, frequency, hematuria and urgency.  Musculoskeletal: Negative for back pain and myalgias.  Neurological: Negative for dizziness, light-headedness and headaches.     Physical Exam Triage Vital Signs ED Triage Vitals  Enc Vitals Group     BP 08/20/18 1236 114/74     Pulse Rate 08/20/18 1236 83     Resp 08/20/18 1236 18     Temp 08/20/18 1236 98.2 F (36.8 C)     Temp Source 08/20/18 1236 Oral     SpO2 08/20/18 1236 98 %     Weight 08/20/18 1237 123 lb (55.8 kg)     Height 08/20/18 1237 5\' 4"  (1.626 m)     Head Circumference --      Peak Flow --      Pain Score 08/20/18 1237 0     Pain Loc --      Pain Edu? --      Excl. in GC? --    No data found.  Updated Vital Signs BP 114/74 (BP Location: Right Arm)   Pulse 83   Temp 98.2 F (36.8 C) (Oral)   Resp 18   Ht 5\' 4"  (1.626 m)   Wt 123 lb (55.8 kg)  SpO2 98%   BMI 21.11 kg/m   Visual Acuity Right Eye Distance:   Left Eye Distance:   Bilateral Distance:    Right Eye Near:   Left Eye Near:    Bilateral Near:     Physical Exam   UC Treatments / Results  Labs (all labs ordered are listed, but only abnormal results are displayed) Labs Reviewed  POCT URINALYSIS DIP (MANUAL ENTRY) - Abnormal; Notable for the following components:      Result Value   Clarity, UA cloudy (*)    Blood, UA moderate (*)    Protein Ur, POC trace (*)    Leukocytes, UA Large (3+) (*)    All other components within normal limits  URINE CULTURE    EKG None  Radiology No results found.  Procedures Procedures (including critical care time)  Medications Ordered in UC Medications - No data to display  Initial Impression / Assessment and Plan / UC Course  I have reviewed the triage vital signs and the nursing notes.  Pertinent labs & imaging results  that were available during my care of the patient were reviewed by me and considered in my medical decision making (see chart for details).     UA c/w UTI Culture sent Home care info provided.  Final Clinical Impressions(s) / UC Diagnoses   Final diagnoses:  Dysuria  Acute cystitis with hematuria     Discharge Instructions      Please take your antibiotic as prescribed. A urine culture has been sent to check the severity of your urinary infection and to determine if you are on the most appropriate antibiotic. The results should come back within 2-3 days and you will be notified even if no medication change is needed. You may try over the counter Azo as well.   Please stay well hydrated and follow up with your family doctor in 1 week if not improving, sooner if worsening.     ED Prescriptions    Medication Sig Dispense Auth. Provider   cephALEXin (KEFLEX) 500 MG capsule Take 1 capsule (500 mg total) by mouth 2 (two) times daily. 14 capsule Lurene ShadowPhelps, Alexsandra Shontz O, PA-C     Controlled Substance Prescriptions [place] Controlled Substance Registry consulted? Not Applicable   Rolla Platehelps, Avien Taha O, PA-C 08/20/18 1255    Lurene ShadowPhelps, Malayah Demuro O, [place] 08/20/18 1259

## 2018-08-20 NOTE — Discharge Instructions (Signed)
°  Please take your antibiotic as prescribed. A urine culture has been sent to check the severity of your urinary infection and to determine if you are on the most appropriate antibiotic. The results should come back within 2-3 days and you will be notified even if no medication change is needed. You may try over the counter Azo as well.   Please stay well hydrated and follow up with your family doctor in 1 week if not improving, sooner if worsening.

## 2018-08-22 ENCOUNTER — Telehealth: Payer: Self-pay | Admitting: *Deleted

## 2018-08-22 LAB — URINE CULTURE
MICRO NUMBER:: 91538834
SPECIMEN QUALITY:: ADEQUATE

## 2018-08-22 NOTE — Telephone Encounter (Signed)
LM with Ucx results and to call back if she has any questions or concerns.  

## 2018-09-09 ENCOUNTER — Ambulatory Visit: Payer: PRIVATE HEALTH INSURANCE | Admitting: Physician Assistant

## 2018-09-09 DIAGNOSIS — Z0289 Encounter for other administrative examinations: Secondary | ICD-10-CM

## 2018-09-16 ENCOUNTER — Ambulatory Visit: Payer: PRIVATE HEALTH INSURANCE | Admitting: Physician Assistant

## 2018-09-16 ENCOUNTER — Other Ambulatory Visit: Payer: Self-pay

## 2018-09-16 ENCOUNTER — Encounter: Payer: Self-pay | Admitting: Physician Assistant

## 2018-09-16 VITALS — BP 118/64 | HR 76 | Temp 98.4°F | Resp 14 | Ht 66.0 in | Wt 121.0 lb

## 2018-09-16 DIAGNOSIS — F988 Other specified behavioral and emotional disorders with onset usually occurring in childhood and adolescence: Secondary | ICD-10-CM | POA: Diagnosis not present

## 2018-09-16 MED ORDER — DEXMETHYLPHENIDATE HCL ER 40 MG PO CP24
40.0000 mg | ORAL_CAPSULE | Freq: Every day | ORAL | 0 refills | Status: DC
Start: 2018-09-16 — End: 2018-10-17

## 2018-09-16 NOTE — Assessment & Plan Note (Signed)
Doing very well. Continue current regimen. Medications refilled. Follow-up 6 months for ADD. Is scheduling next month for PAP.

## 2018-09-16 NOTE — Patient Instructions (Signed)
Please continue current medication regimen. I am glad you are doing so well!  Schedule your PAP for February.

## 2018-09-16 NOTE — Progress Notes (Signed)
Patient presents to clinic today for follow-up of ADD. Is currently on a regimen of Methylphenidate 40 mg. Endorses taking daily as directed. Pt endorses medication is helping with staying on task, focus, and organization, carrying all the way through the day. No sleep problems. Medication -, appetite normal, staying hydrated.   Is planning to schedule mammogram in February.  Past Medical History:  Diagnosis Date  . ADD (attention deficit disorder)     Current Outpatient Medications on File Prior to Visit  Medication Sig Dispense Refill  . Dexmethylphenidate HCl 40 MG CP24 Take 1 capsule (40 mg total) by mouth daily. 30 capsule 0  . norethindrone-ethinyl estradiol 1/35 (ORTHO-NOVUM 1/35, 28,) tablet Take 1 tablet by mouth daily. 1 Package 11   No current facility-administered medications on file prior to visit.     Allergies  Allergen Reactions  . Adderall [Amphetamine-Dextroamphetamine] Hives    Also with insomnia and weight loss  . Albuterol Sulfate     Seizures     Family History  Problem Relation Age of Onset  . Cancer Maternal Grandmother 72       breast  . Hypertension Maternal Grandfather   . Diabetes Maternal Grandfather   . Cancer Paternal Grandfather        throat cancer    Social History   Socioeconomic History  . Marital status: Single    Spouse name: Not on file  . Number of children: Not on file  . Years of education: Not on file  . Highest education level: Not on file  Occupational History  . Not on file  Social Needs  . Financial resource strain: Not on file  . Food insecurity:    Worry: Not on file    Inability: Not on file  . Transportation needs:    Medical: Not on file    Non-medical: Not on file  Tobacco Use  . Smoking status: Never Smoker  . Smokeless tobacco: Never Used  Substance and Sexual Activity  . Alcohol use: No  . Drug use: No  . Sexual activity: Not on file  Lifestyle  . Physical activity:    Days per week: Not on file     Minutes per session: Not on file  . Stress: Not on file  Relationships  . Social connections:    Talks on phone: Not on file    Gets together: Not on file    Attends religious service: Not on file    Active member of club or organization: Not on file    Attends meetings of clubs or organizations: Not on file    Relationship status: Not on file  Other Topics Concern  . Not on file  Social History Narrative  . Not on file   Review of Systems - See HPI.  All other ROS are negative.  BP 118/64   Pulse 76   Temp 98.4 F (36.9 C) (Oral)   Resp 14   Ht 5\' 6"  (1.676 m)   Wt 121 lb (54.9 kg)   SpO2 98%   BMI 19.53 kg/m   Physical Exam Vitals signs reviewed.  Constitutional:      Appearance: Normal appearance.  HENT:     Head: Normocephalic and atraumatic.     Right Ear: Tympanic membrane normal.     Left Ear: Tympanic membrane normal.     Nose: Nose normal.     Mouth/Throat:     Mouth: Mucous membranes are moist.  Eyes:  Conjunctiva/sclera: Conjunctivae normal.     Pupils: Pupils are equal, round, and reactive to light.  Neck:     Musculoskeletal: Neck supple.  Cardiovascular:     Rate and Rhythm: Normal rate and regular rhythm.     Pulses: Normal pulses.     Heart sounds: Normal heart sounds.  Pulmonary:     Effort: Pulmonary effort is normal.  Neurological:     Mental Status: She is alert.  Psychiatric:        Mood and Affect: Mood normal.     Recent Results (from the past 2160 hour(s))  POCT urinalysis dipstick (new)     Status: Abnormal   Collection Time: 08/20/18 12:40 PM  Result Value Ref Range   Color, UA yellow yellow   Clarity, UA cloudy (A) clear   Glucose, UA negative negative mg/dL   Bilirubin, UA negative negative   Ketones, POC UA negative negative mg/dL   Spec Grav, UA 2.122 4.825 - 1.025   Blood, UA moderate (A) negative   pH, UA 7.0 5.0 - 8.0   Protein Ur, POC trace (A) negative mg/dL   Urobilinogen, UA 0.2 0.2 or 1.0 E.U./dL    Nitrite, UA Negative Negative   Leukocytes, UA Large (3+) (A) Negative  Urine culture     Status: None   Collection Time: 08/20/18 12:40 PM  Result Value Ref Range   MICRO NUMBER: [id number]    SPECIMEN QUALITY: Adequate    Sample Source URINE    STATUS: FINAL    Result:      Single organism less than 10,000 CFU/mL isolated. These organisms, commonly found on external and internal genitalia, are considered colonizers. No further testing performed.    Assessment/Plan: ADD (attention deficit disorder) Doing very well. Continue current regimen. Medications refilled. Follow-up 6 months for ADD. Is scheduling next month for PAP.     Piedad Climes, PA-C

## 2018-10-02 ENCOUNTER — Other Ambulatory Visit: Payer: Self-pay

## 2018-10-02 ENCOUNTER — Ambulatory Visit: Payer: PRIVATE HEALTH INSURANCE | Admitting: Physician Assistant

## 2018-10-02 ENCOUNTER — Other Ambulatory Visit (HOSPITAL_COMMUNITY)
Admission: RE | Admit: 2018-10-02 | Discharge: 2018-10-02 | Disposition: A | Discharge status: Home / Self Care | Payer: PRIVATE HEALTH INSURANCE | Source: Ambulatory Visit | Attending: Physician Assistant | Admitting: Physician Assistant

## 2018-10-02 ENCOUNTER — Encounter: Payer: Self-pay | Admitting: Physician Assistant

## 2018-10-02 VITALS — BP 104/80 | HR 94 | Temp 98.3°F | Resp 14 | Ht 66.0 in | Wt 118.0 lb

## 2018-10-02 DIAGNOSIS — Z124 Encounter for screening for malignant neoplasm of cervix: Secondary | ICD-10-CM | POA: Diagnosis not present

## 2018-10-02 NOTE — Patient Instructions (Addendum)
Pap Test  Why am I having this test?  A Pap test, also called a Pap smear, is a screening test to check for signs of:  · Cancer of the vagina, cervix, and uterus. The cervix is the lower part of the uterus that opens into the vagina.  · Infection.  · Changes that may be a sign that cancer is developing (precancerous changes).  Women need this test on a regular basis. In general, you should have a Pap test every 3 years until you reach menopause or age 24. Women aged 30-60 may choose to have their Pap test done at the same time as an HPV (human papillomavirus) test every 5 years (instead of every 3 years).  Your health care provider may recommend having Pap tests more or less often depending on your medical conditions and past Pap test results.  What kind of sample is taken?    Your health care provider will collect a sample of cells from the surface of your cervix. This will be done using a small cotton swab, plastic spatula, or brush. This sample is often collected during a pelvic exam, when you are lying on your back on an exam table with feet in footrests (stirrups).  In some cases, fluids (secretions) from the cervix or vagina may also be collected.  How do I prepare for this test?  · Be aware of where you are in your menstrual cycle. If you are menstruating on the day of the test, you may be asked to reschedule.  · You may need to reschedule if you have a known vaginal infection on the day of the test.  · Follow instructions from your health care provider about:  ? Changing or stopping your regular medicines. Some medicines can cause abnormal test results, such as digitalis and tetracycline.  ? Avoiding douching or taking a bath the day before or the day of the test.  Tell a health care provider about:  · Any allergies you have.  · All medicines you are taking, including vitamins, herbs, eye drops, creams, and over-the-counter medicines.  · Any blood disorders you have.  · Any surgeries you have had.  · Any  medical conditions you have.  · Whether you are pregnant or may be pregnant.  How are the results reported?  Your test results will be reported as either abnormal or normal.  A false-positive result can occur. A false positive is incorrect because it means that a condition is present when it is not.  A false-negative result can occur. A false negative is incorrect because it means that a condition is not present when it is.  What do the results mean?  A normal test result means that you do not have signs of cancer of the vagina, cervix, or uterus.  An abnormal result may mean that you have:  · Cancer. A Pap test by itself is not enough to diagnose cancer. You will have more tests done in this case.  · Precancerous changes in your vagina, cervix, or uterus.  · Inflammation of the cervix.  · An STD (sexually transmitted disease).  · A fungal infection.  · A parasite infection.  Talk with your health care provider about what your results mean.  Questions to ask your health care provider  Ask your health care provider, or the department that is doing the test:  · When will my results be ready?  · How will I get my results?  · What are my   treatment options?  · What other tests do I need?  · What are my next steps?  Summary  · In general, women should have a Pap test every 3 years until they reach menopause or age 24.  · Your health care provider will collect a sample of cells from the surface of your cervix. This will be done using a small cotton swab, plastic spatula, or brush.  · In some cases, fluids (secretions) from the cervix or vagina may also be collected.  This information is not intended to replace advice given to you by your health care provider. Make sure you discuss any questions you have with your health care provider.  Document Released: 11/04/2002 Document Revised: 04/23/2017 Document Reviewed: 04/23/2017  Elsevier Interactive Patient Education © 2019 Elsevier Inc.

## 2018-10-02 NOTE — Progress Notes (Signed)
Subjective:     Jeanette Moreno is a 23 y.o. woman who comes in today for a  pap smear only. Her most recent annual exam was on 09/16/18. This is her first PAP smear. Previous abnormal Pap smears: N/A. Contraception: condoms  The following portions of the patient's history were reviewed and updated as appropriate: allergies, current medications, past family history, past medical history, past social history, past surgical history and problem list.  Review of Systems Pertinent items are noted in HPI.   Objective:    BP 104/80   Pulse 94   Temp 98.3 F (36.8 C) (Oral)   Resp 14   Ht 5\' 6"  (1.676 m)   Wt 118 lb (53.5 kg)   LMP 09/27/2018   SpO2 98%   BMI 19.05 kg/m  Pelvic Exam: adnexae not palpable, cervix normal in appearance, external genitalia normal and scant discharge noted (clear and thing). Pap smear obtained.   Assessment:    Screening pap smear.   Plan:    Follow up in 1 year, or as indicated by Pap results.

## 2018-10-04 ENCOUNTER — Other Ambulatory Visit: Payer: Self-pay | Admitting: Physician Assistant

## 2018-10-04 DIAGNOSIS — R87612 Low grade squamous intraepithelial lesion on cytologic smear of cervix (LGSIL): Secondary | ICD-10-CM

## 2018-10-04 DIAGNOSIS — B3731 Acute candidiasis of vulva and vagina: Secondary | ICD-10-CM

## 2018-10-04 DIAGNOSIS — B373 Candidiasis of vulva and vagina: Secondary | ICD-10-CM

## 2018-10-04 LAB — CYTOLOGY - PAP
BACTERIAL VAGINITIS: NEGATIVE
CANDIDA VAGINITIS: POSITIVE — AB
Chlamydia: NEGATIVE
Neisseria Gonorrhea: NEGATIVE
Trichomonas: NEGATIVE

## 2018-10-04 MED ORDER — FLUCONAZOLE 150 MG PO TABS: ORAL_TABLET | ORAL | 0 refills | Status: DC

## 2018-10-17 ENCOUNTER — Other Ambulatory Visit: Payer: Self-pay | Admitting: Physician Assistant

## 2018-10-18 MED ORDER — DEXMETHYLPHENIDATE HCL ER 40 MG PO CP24: 40.0000 mg | ORAL_CAPSULE | Freq: Every day | ORAL | 0 refills | Status: DC

## 2018-10-18 NOTE — Telephone Encounter (Signed)
Last OV 10/02/18 Dexmethylphenidate last filled 09/16/18 #30 with 0

## 2018-11-05 ENCOUNTER — Encounter: Payer: Self-pay | Admitting: Physician Assistant

## 2018-11-05 ENCOUNTER — Ambulatory Visit: Payer: PRIVATE HEALTH INSURANCE | Admitting: Physician Assistant

## 2018-11-05 ENCOUNTER — Other Ambulatory Visit: Payer: Self-pay

## 2018-11-05 VITALS — BP 120/74 | HR 58 | Temp 98.5°F | Resp 14 | Ht 66.0 in | Wt 124.6 lb

## 2018-11-05 DIAGNOSIS — R300 Dysuria: Secondary | ICD-10-CM | POA: Diagnosis not present

## 2018-11-05 LAB — POCT URINALYSIS DIPSTICK
BILIRUBIN UA: NEGATIVE
Glucose, UA: NEGATIVE
KETONES UA: NEGATIVE
Nitrite, UA: NEGATIVE
Protein, UA: NEGATIVE
SPEC GRAV UA: 1.01 (ref 1.010–1.025)
Urobilinogen, UA: 0.2 E.U./dL
pH, UA: 6 (ref 5.0–8.0)

## 2018-11-05 IMAGING — DX DG ANKLE COMPLETE 3+V*R*
3 series · 3 of 3 positions shown · non-contrast
Comparison: None.

CLINICAL DATA: Right foot and ankle pain since a pallet ran over
the patient's foot today. Initial encounter.

EXAM:
RIGHT ANKLE - COMPLETE 3+ VIEW

[ankle ap]
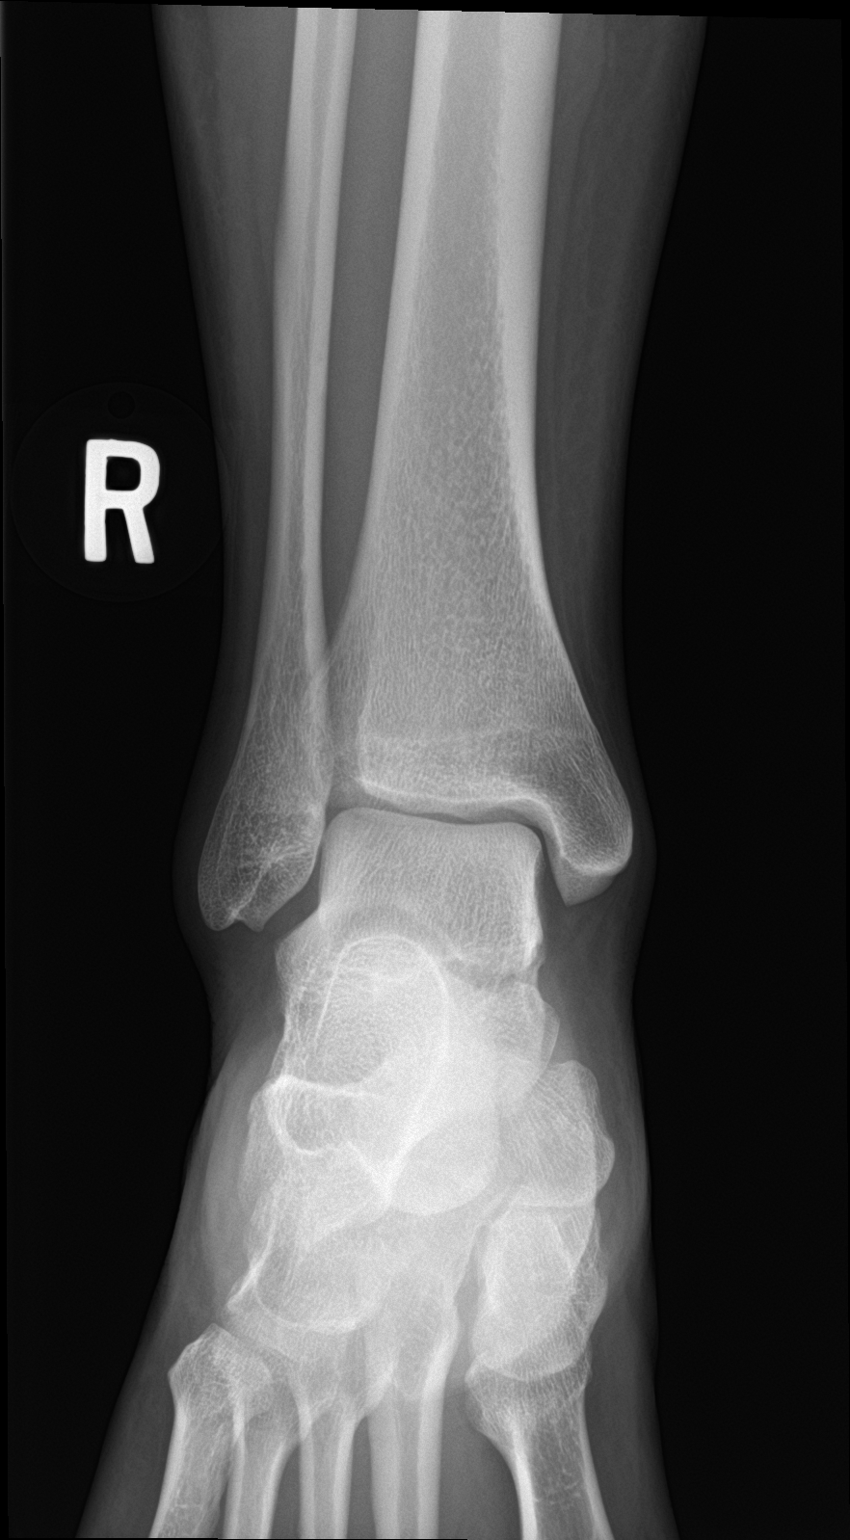

[ankle obl]
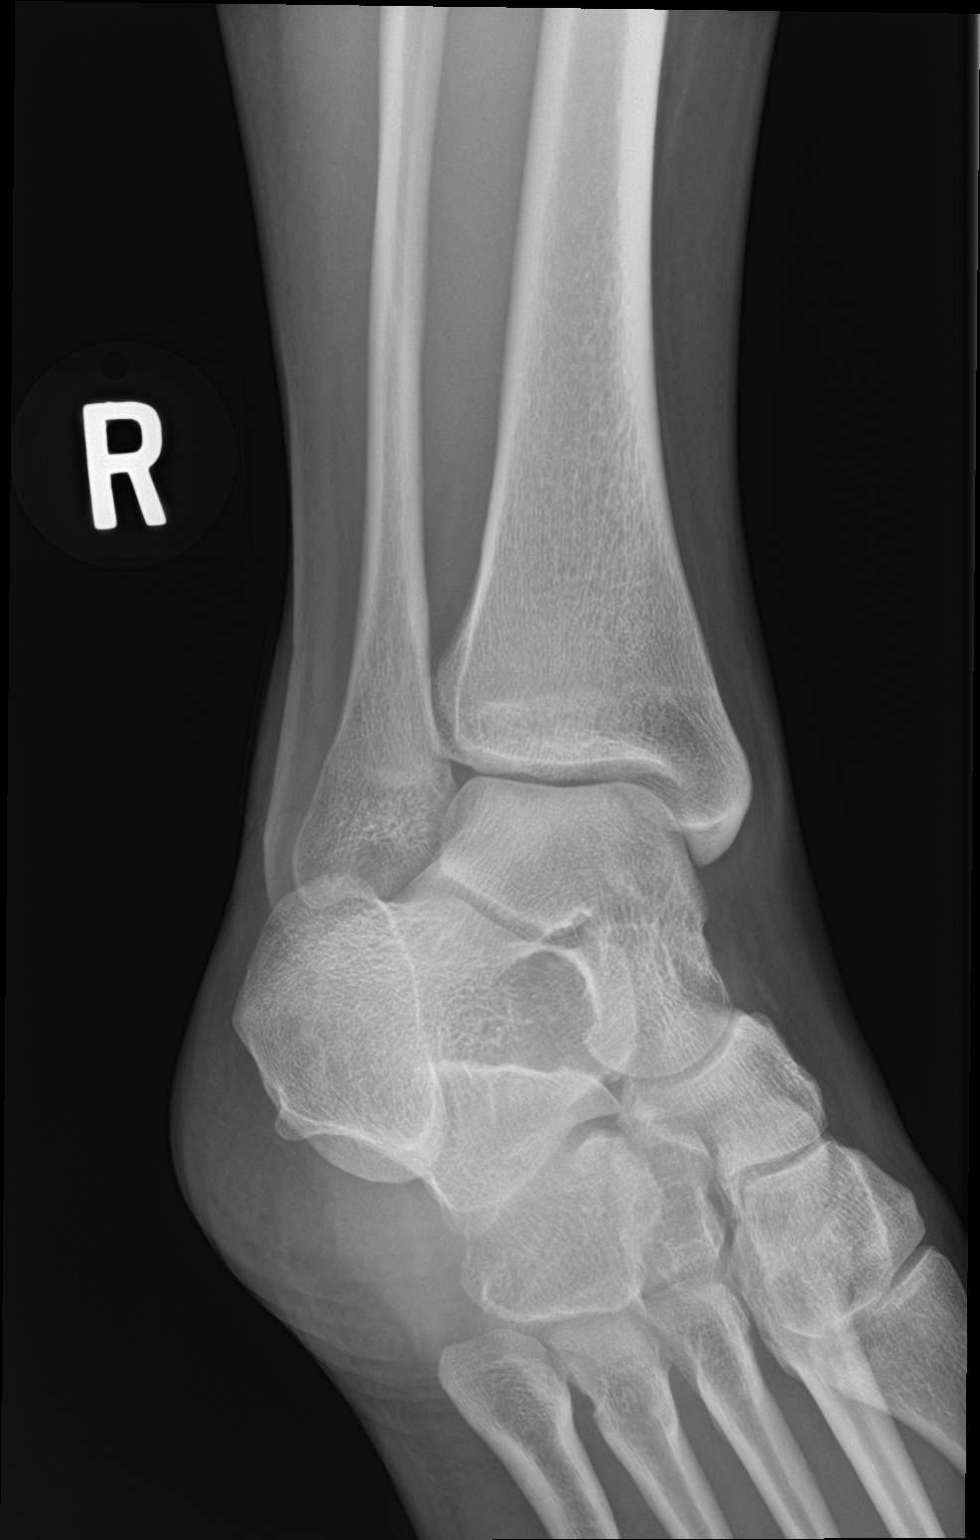

[ankle lat]
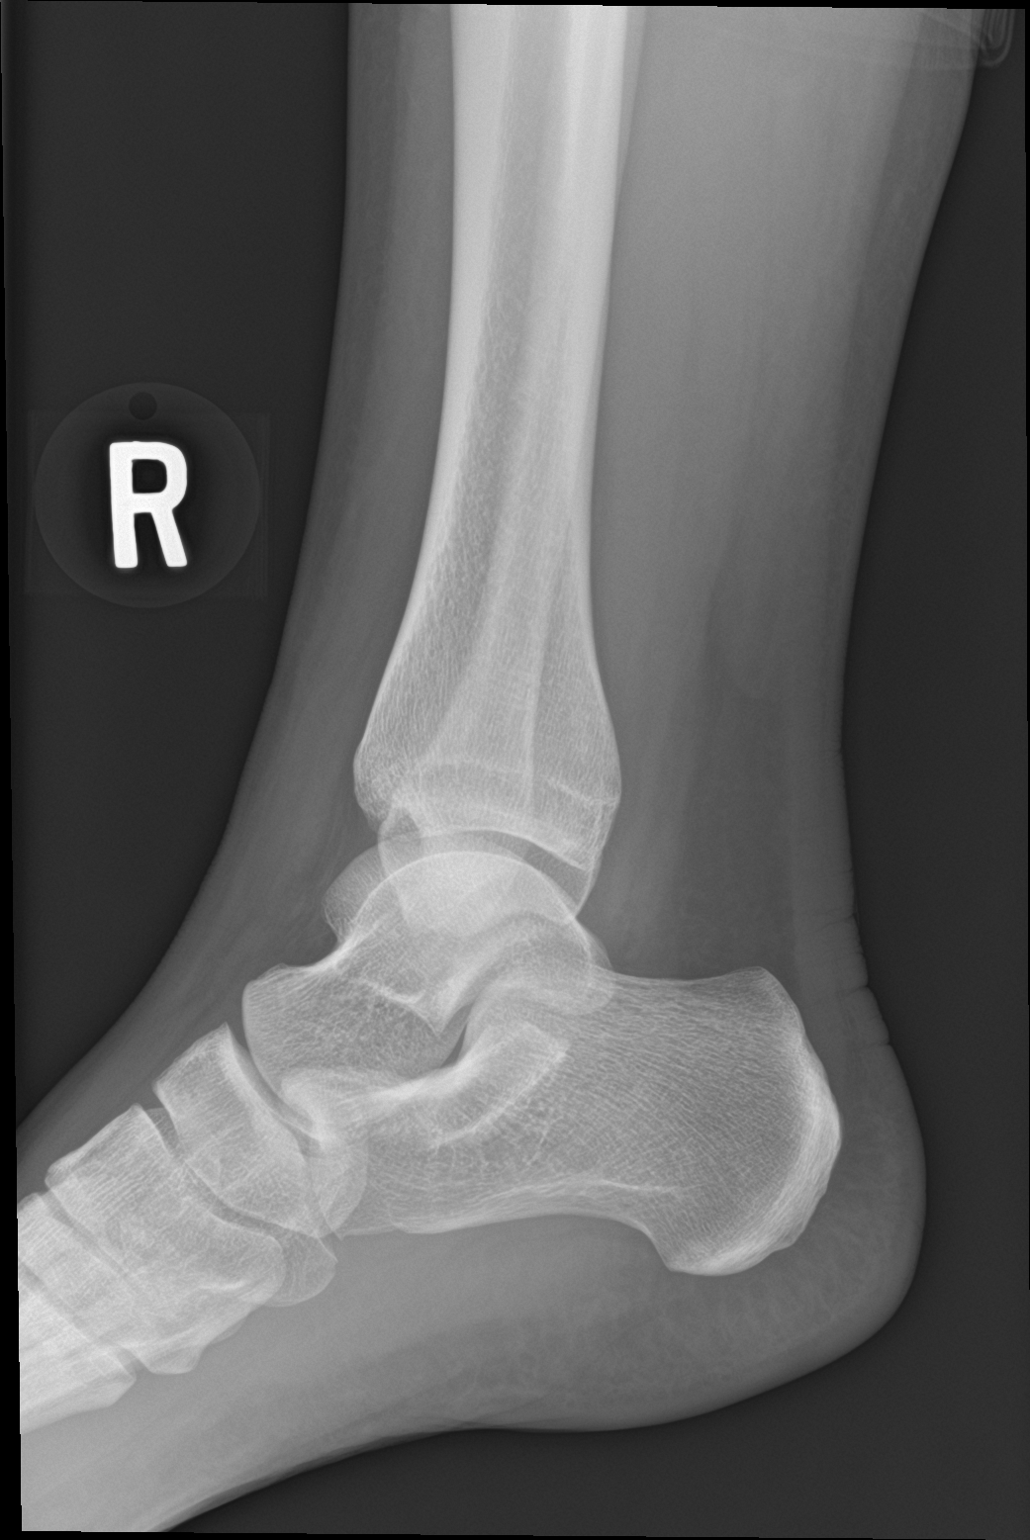

[3 of 3 positions shown; findings below may reference images not displayed]

FINDINGS: There is no evidence of fracture, dislocation, or joint effusion.
There is no evidence of arthropathy or other focal bone abnormality.
Soft tissues are unremarkable.
IMPRESSION: Negative exam.

## 2018-11-05 MED ORDER — NITROFURANTOIN MONOHYD MACRO 100 MG PO CAPS: 100.0000 mg | ORAL_CAPSULE | Freq: Two times a day (BID) | ORAL | 0 refills | Status: DC

## 2018-11-05 NOTE — Patient Instructions (Signed)
Your symptoms are consistent with a bladder infection, also called acute cystitis. Please take your antibiotic (Macrobid) as directed until all pills are gone.  Stay very well hydrated.  Consider a daily probiotic (Align, Culturelle, or Activia) to help prevent stomach upset caused by the antibiotic.  Taking a probiotic daily may also help prevent recurrent UTIs.  Also consider taking AZO (Phenazopyridine) tablets to help decrease pain with urination.  I will call you with your urine testing results.  We will change antibiotics if indicated.  Call or return to clinic if symptoms are not resolved by completion of antibiotic.   Urinary Tract Infection A urinary tract infection (UTI) can occur any place along the urinary tract. The tract includes the kidneys, ureters, bladder, and urethra. A type of germ called bacteria often causes a UTI. UTIs are often helped with antibiotic medicine.  HOME CARE   If given, take antibiotics as told by your doctor. Finish them even if you start to feel better.  Drink enough fluids to keep your pee (urine) clear or pale yellow.  Avoid tea, drinks with caffeine, and bubbly (carbonated) drinks.  Pee often. Avoid holding your pee in for a long time.  Pee before and after having sex (intercourse).  Wipe from front to back after you poop (bowel movement) if you are a woman. Use each tissue only once. GET HELP RIGHT AWAY IF:   You have back pain.  You have lower belly (abdominal) pain.  You have chills.  You feel sick to your stomach (nauseous).  You throw up (vomit).  Your burning or discomfort with peeing does not go away.  You have a fever.  Your symptoms are not better in 3 days. MAKE SURE YOU:   Understand these instructions.  Will watch your condition.  Will get help right away if you are not doing well or get worse. Document Released: 01/31/2008 Document Revised: 05/08/2012 Document Reviewed: 03/14/2012 ExitCare Patient Information 2015  [employer]. This information is not intended to replace advice given to you by your health care provider. Make sure you discuss any questions you have with your health care provider.   

## 2018-11-05 NOTE — Progress Notes (Signed)
Patient presents to clinic today c/o 1 day of dysuria, urinary urgency and frequency. Notes a small amount of blood when wiping this morning. None since. Denies sexual intercourse, vaginal pain or discharge. Denies fever, vomiting, nausea. Denies flank pain. .   Past Medical History:  Diagnosis Date  . ADD (attention deficit disorder)     Current Outpatient Medications on File Prior to Visit  Medication Sig Dispense Refill  . Dexmethylphenidate HCl 40 MG CP24 Take 1 capsule (40 mg total) by mouth daily. 30 capsule 0  . norethindrone-ethinyl estradiol 1/35 (ORTHO-NOVUM 1/35, 28,) tablet Take 1 tablet by mouth daily. 1 Package 11  . fluconazole (DIFLUCAN) 150 MG tablet Take 1 tablet by mouth, if symptoms continue to repeat dose in 3 days. (Patient not taking: Reported on 11/05/2018) 2 tablet 0   No current facility-administered medications on file prior to visit.     Allergies  Allergen Reactions  . Adderall [Amphetamine-Dextroamphetamine] Hives    Also with insomnia and weight loss  . Albuterol Sulfate     Seizures     Family History  Problem Relation Age of Onset  . Cancer Maternal Grandmother 89       breast  . Hypertension Maternal Grandfather   . Diabetes Maternal Grandfather   . Cancer Paternal Grandfather        throat cancer    Social History   Socioeconomic History  . Marital status: Single    Spouse name: Not on file  . Number of children: Not on file  . Years of education: Not on file  . Highest education level: Not on file  Occupational History  . Not on file  Social Needs  . Financial resource strain: Not on file  . Food insecurity:    Worry: Not on file    Inability: Not on file  . Transportation needs:    Medical: Not on file    Non-medical: Not on file  Tobacco Use  . Smoking status: Never Smoker  . Smokeless tobacco: Never Used  Substance and Sexual Activity  . Alcohol use: No  . Drug use: No  . Sexual activity: Yes    Birth  control/protection: Pill, Condom  Lifestyle  . Physical activity:    Days per week: Not on file    Minutes per session: Not on file  . Stress: Not on file  Relationships  . Social connections:    Talks on phone: Not on file    Gets together: Not on file    Attends religious service: Not on file    Active member of club or organization: Not on file    Attends meetings of clubs or organizations: Not on file    Relationship status: Not on file  Other Topics Concern  . Not on file  Social History Narrative  . Not on file   Review of Systems - See HPI.  All other ROS are negative.  BP 120/74   Pulse (!) 58   Temp 98.5 F (36.9 C) (Oral)   Resp 14   Ht  (1.676 m)   Wt 124 lb 9.6 oz (56.5 kg)   BMI 20.11 kg/m   Physical Exam Vitals signs reviewed.  Constitutional:      Appearance: Normal appearance.  HENT:     Head: Normocephalic and atraumatic.  Neck:     Musculoskeletal: Neck supple.  Cardiovascular:     Rate and Rhythm: Normal rate and regular rhythm.     Heart sounds:  Normal heart sounds.  Pulmonary:     Effort: Pulmonary effort is normal.  Abdominal:     General: Bowel sounds are normal. There is no distension.     Palpations: Abdomen is soft.     Tenderness: There is no abdominal tenderness. There is no right CVA tenderness or left CVA tenderness.  Neurological:     Mental Status: She is alert.     Recent Results (from the past 2160 hour(s))  POCT urinalysis dipstick (new)     Status: Abnormal   Collection Time: 08/20/18 12:40 PM  Result Value Ref Range   Color, UA yellow yellow   Clarity, UA cloudy (A) clear   Glucose, UA negative negative mg/dL   Bilirubin, UA negative negative   Ketones, POC UA negative negative mg/dL   Spec Grav, UA 4.982 6.415 - 1.025   Blood, UA moderate (A) negative   pH, UA 7.0 5.0 - 8.0   Protein Ur, POC trace (A) negative mg/dL   Urobilinogen, UA 0.2 0.2 or 1.0 E.U./dL   Nitrite, UA Negative Negative   Leukocytes, UA  Large (3+) (A) Negative  Urine culture     Status: None   Collection Time: 08/20/18 12:40 PM  Result Value Ref Range   MICRO NUMBER: [id number]    SPECIMEN QUALITY: Adequate    Sample Source URINE    STATUS: FINAL    Result:      Single organism less than 10,000 CFU/mL isolated. These organisms, commonly found on external and internal genitalia, are considered colonizers. No further testing performed.  Cytology - PAP( [place])     Status: Abnormal   Collection Time: 10/02/18 12:00 AM  Result Value Ref Range   Adequacy (A)     Satisfactory for evaluation  endocervical/transformation zone component PRESENT.   Diagnosis (A)     LOW GRADE SQUAMOUS INTRAEPITHELIAL LESION: CIN-1/ HPV (LSIL).   Bacterial vaginitis Negative for Bacterial Vaginitis Microorganisms     Comment: Normal Reference Range - Negative   Candida vaginitis POSITIVE for Candida species (A)     Comment: Normal Reference Range - Negative   Trichomonas Negative     Comment: Normal Reference Range - Negative   Chlamydia Negative     Comment: Normal Reference Range - Negative   Neisseria gonorrhea Negative     Comment: Normal Reference Range - Negative   Material Submitted CervicoVaginal Pap [ThinPrep Imaged] (A)    CYTOLOGY - PAP PAP RESULT   POCT Urinalysis Dipstick     Status: Abnormal   Collection Time: 11/05/18 11:20 AM  Result Value Ref Range   Color, UA yellow    Clarity, UA clear    Glucose, UA Negative Negative   Bilirubin, UA negative    Ketones, UA negative    Spec Grav, UA 1.010 1.010 - 1.025   Blood, UA 2+    pH, UA 6.0 5.0 - 8.0   Protein, UA Negative Negative   Urobilinogen, UA 0.2 0.2 or 1.0 E.U./dL   Nitrite, UA negative    Leukocytes, UA Trace (A) Negative   Appearance     Odor      Assessment/Plan: 1. Dysuria UA with blood and LE. Will send for culture. Giving history and classic symptoms will start empiric treatment for UTI. Start Macrobid 100 mg BID x 5 days. Supportive measures and OTC  medications reviewed. Will change based on urine culture results.   - POCT Urinalysis Dipstick - Urine Culture - nitrofurantoin, macrocrystal-monohydrate, (MACROBID) 100 MG capsule;  Take 1 capsule (100 mg total) by mouth 2 (two) times daily.  Dispense: 10 capsule; Refill: 0   Piedad Climes, PA-C

## 2018-11-07 ENCOUNTER — Ambulatory Visit: Payer: Self-pay | Admitting: *Deleted

## 2018-11-07 DIAGNOSIS — B373 Candidiasis of vulva and vagina: Secondary | ICD-10-CM

## 2018-11-07 DIAGNOSIS — B3731 Acute candidiasis of vulva and vagina: Secondary | ICD-10-CM

## 2018-11-07 LAB — URINE CULTURE
MICRO NUMBER: 300674
Result:: NO GROWTH
SPECIMEN QUALITY: ADEQUATE

## 2018-11-07 MED ORDER — CEPHALEXIN 500 MG PO CAPS: 500.0000 mg | ORAL_CAPSULE | Freq: Two times a day (BID) | ORAL | 0 refills | Status: AC

## 2018-11-07 NOTE — Telephone Encounter (Signed)
Pt calling stating that she has been experiencing flushed cheeks, feeling "hot" and sweating to the back approximately an hour after taking Macrobid. Pt states she has been taking Macrobid since 3/10 and experiences this every time she takes the medication.Pt denies any rash, itching or other symptoms at this time. Pt states she has been taking the medication with food. Pt also asking about urine culture results. Pt states she went to work and have to leave due to urinary frequency. Pt states she has been to the bathroom to urinate approximately 12 times. Pt states she is also experiencing burning with urination and urgency.   Reason for Disposition . Caller has URGENT medication question about med that PCP prescribed and triager unable to answer question  Answer Assessment - Initial Assessment Questions 1. SYMPTOMS: "Do you have any symptoms?"     Flushing of the cheeks with taking medication and back sweating  Protocols used: MEDICATION QUESTION CALL-A-AH

## 2018-11-07 NOTE — Telephone Encounter (Signed)
1 -- stop the Macrobid ASAP.  2 -- urine culture was negative. I would recommend giving ongoing symptoms that she have reassessment ASAP with repeat urine and question need for vaginal exam and swab to assess for other potential culprits besides a typical bladder infection. If unable to do today, can start an empiric Keflex 500 mg BID x 7 days while waiting on further testing.   ER for any acutely worsening symptoms.

## 2018-11-07 NOTE — Telephone Encounter (Signed)
Spoke with patient and advised to stop Macrobid. Patient did take a second dose about 20 mins ago. Giving her negative urine culture.  Repeat urine testing and vaginal exam is unable to be completed today. Patient is agreeable with starting Keflex 500 mg bid x 7 days. Advised to contact office if symptoms worsens Rx sent to the pharmacy.

## 2018-11-17 ENCOUNTER — Other Ambulatory Visit: Payer: Self-pay | Admitting: Physician Assistant

## 2018-11-18 MED ORDER — DEXMETHYLPHENIDATE HCL ER 40 MG PO CP24
40.0000 mg | ORAL_CAPSULE | Freq: Every day | ORAL | 0 refills | Status: DC
Start: 2018-11-18 — End: 2018-12-19

## 2018-11-18 NOTE — Telephone Encounter (Signed)
Last OV 11/05/18 dexmethylphenidate last filled 10/18/18 #30 with 0

## 2018-12-19 ENCOUNTER — Other Ambulatory Visit: Payer: Self-pay | Admitting: Physician Assistant

## 2018-12-19 MED ORDER — DEXMETHYLPHENIDATE HCL ER 40 MG PO CP24
40.0000 mg | ORAL_CAPSULE | Freq: Every day | ORAL | 0 refills | Status: DC
Start: 2018-12-19 — End: 2019-01-22

## 2018-12-19 NOTE — Telephone Encounter (Signed)
Methylphenidate last rx 11/18/18 #30 LOV: 09/16/18 for ADD

## 2019-01-03 ENCOUNTER — Telehealth: Payer: Self-pay | Admitting: Emergency Medicine

## 2019-01-03 NOTE — Telephone Encounter (Signed)
Copied from CRM [id number]. Topic: General - Inquiry >> Jan 03, 2019  9:44 AM Lynne Logan D wrote: Reason for CRM: Pt called to request a copy of her shot records. She would like to be able to pick these up. Please advise. CB#[phone]

## 2019-01-22 ENCOUNTER — Other Ambulatory Visit: Payer: Self-pay | Admitting: Physician Assistant

## 2019-01-22 MED ORDER — DEXMETHYLPHENIDATE HCL ER 40 MG PO CP24: 40.0000 mg | ORAL_CAPSULE | Freq: Every day | ORAL | 0 refills | Status: DC

## 2019-02-24 ENCOUNTER — Other Ambulatory Visit: Payer: Self-pay | Admitting: Physician Assistant

## 2019-02-24 MED ORDER — DEXMETHYLPHENIDATE HCL ER 40 MG PO CP24: 40.0000 mg | ORAL_CAPSULE | Freq: Every day | ORAL | 0 refills | Status: DC

## 2019-02-24 NOTE — Telephone Encounter (Signed)
Methylphendiate last rx 01/22/19 #30 LOV: 09/16/18 ADD, LOV: 11/05/18 acute CSC: 10/30/16 UDS: 11/26/15  Please advise

## 2019-03-25 ENCOUNTER — Other Ambulatory Visit: Payer: Self-pay | Admitting: Physician Assistant

## 2019-03-25 NOTE — Telephone Encounter (Signed)
Last refill:02/24/19 #30, 0 Last OV:11/05/18, dx. dysuria

## 2019-03-26 MED ORDER — DEXMETHYLPHENIDATE HCL ER 40 MG PO CP24: 40.0000 mg | ORAL_CAPSULE | Freq: Every day | ORAL | 0 refills | Status: DC

## 2019-04-30 ENCOUNTER — Ambulatory Visit (INDEPENDENT_AMBULATORY_CARE_PROVIDER_SITE_OTHER): Payer: PRIVATE HEALTH INSURANCE | Admitting: Physician Assistant

## 2019-04-30 ENCOUNTER — Encounter: Payer: Self-pay | Admitting: Physician Assistant

## 2019-04-30 ENCOUNTER — Other Ambulatory Visit: Payer: Self-pay | Admitting: Physician Assistant

## 2019-04-30 VITALS — BP 119/79 | Temp 98.8°F | Ht 66.0 in | Wt 116.0 lb

## 2019-04-30 DIAGNOSIS — F988 Other specified behavioral and emotional disorders with onset usually occurring in childhood and adolescence: Secondary | ICD-10-CM

## 2019-04-30 DIAGNOSIS — Z3041 Encounter for surveillance of contraceptive pills: Secondary | ICD-10-CM | POA: Diagnosis not present

## 2019-04-30 MED ORDER — DEXMETHYLPHENIDATE HCL ER 40 MG PO CP24: 40.0000 mg | ORAL_CAPSULE | Freq: Every day | ORAL | 0 refills | Status: DC

## 2019-04-30 MED ORDER — ORTHO-NOVUM 1/35 (28) 1-35 MG-MCG PO TABS: 1.0000 | ORAL_TABLET | Freq: Every day | ORAL | 11 refills | Status: DC

## 2019-04-30 NOTE — Telephone Encounter (Signed)
Last OV 11/05/18 Dexmethylphenidate last filled 03/26/19 #30 with 0

## 2019-04-30 NOTE — Progress Notes (Signed)
I have discussed the procedure for the virtual visit with the patient who has given consent to proceed with assessment and treatment.   Rosenda Geffrard S Tyrees Chopin, CMA     

## 2019-04-30 NOTE — Progress Notes (Signed)
   Virtual Visit via Video   I connected with patient on 04/30/19 at  4:15 PM EDT by a video enabled telemedicine application and verified that I am speaking with the correct person using two identifiers.  Location patient: Home Location provider: [facility], Office Persons participating in the virtual visit: Patient, Provider, [place] (Patina Moore)  I discussed the limitations of evaluation and management by telemedicine and the availability of in person appointments. The patient expressed understanding and agreed to proceed.  Subjective:   HPI:   Patient presents via Doxy.Me today to follow-up regarding ADD. Is currently on a regimen of dexmethylphenidate 40 mg QD. Takes only on work days. Still noting significant improvement in her symptoms with current regimen. Tolerating well overall. Is eating and sleeping well. Notes some weight loss but was taking a green tea supplement which she has just stopped..   Needs a refill of her OCPs. Has been taking as directed. Is not out of medication yet.   ROS:   See pertinent positives and negatives per HPI.  Patient Active Problem List   Diagnosis Date Noted  . Immunity status testing 01/26/2017  . Visit for preventive health examination 03/14/2016  . Acne vulgaris 05/25/2015  . ADD (attention deficit disorder) 03/12/2014    Social History   Tobacco Use  . Smoking status: Never Smoker  . Smokeless tobacco: Never Used  Substance Use Topics  . Alcohol use: No    Current Outpatient Medications:  .  Dexmethylphenidate HCl 40 MG CP24, Take 1 capsule (40 mg total) by mouth daily., Disp: 30 capsule, Rfl: 0 .  norethindrone-ethinyl estradiol 1/35 ([place] 1/35, 28,) tablet, Take 1 tablet by mouth daily., Disp: 1 Package, Rfl: 11  Allergies  Allergen Reactions  . Adderall [Amphetamine-Dextroamphetamine] Hives    Also with insomnia and weight loss  . Albuterol Sulfate     Seizures     Objective:   BP 119/79   Temp 98.8  F (37.1 C) (Oral)   Ht 5\' 6"  (1.676 m)   Wt 116 lb (52.6 kg)   BMI 18.72 kg/m   Patient is well-developed, well-nourished in no acute distress.  Resting comfortably at home.  Head is normocephalic, atraumatic.  No labored breathing.  Speech is clear and coherent with logical content.  Patient is alert and oriented at baseline.   Assessment and Plan:   1. Attention deficit disorder (ADD) without hyperactivity Stable. Continue current regimen. Medications refilled. Will watch weight now that she has stopped green tea supplement.  - Dexmethylphenidate HCl 40 MG CP24; Take 1 capsule (40 mg total) by mouth daily.  Dispense: 30 capsule; Refill: 0  2. Encounter for surveillance of contraceptive pills Taking as directed without missing dose. Refill sent.     Leeanne Rio, PA-C 04/30/2019

## 2019-06-03 ENCOUNTER — Other Ambulatory Visit: Payer: Self-pay | Admitting: Physician Assistant

## 2019-06-03 DIAGNOSIS — F988 Other specified behavioral and emotional disorders with onset usually occurring in childhood and adolescence: Secondary | ICD-10-CM

## 2019-06-03 NOTE — Telephone Encounter (Signed)
Last OV 04/30/19 Dexmethylphenidate last filled 04/30/19 #30 with 0

## 2019-06-04 MED ORDER — DEXMETHYLPHENIDATE HCL ER 40 MG PO CP24: 40.0000 mg | ORAL_CAPSULE | Freq: Every day | ORAL | 0 refills | Status: DC

## 2019-07-07 ENCOUNTER — Other Ambulatory Visit: Payer: Self-pay | Admitting: Physician Assistant

## 2019-07-07 DIAGNOSIS — F988 Other specified behavioral and emotional disorders with onset usually occurring in childhood and adolescence: Secondary | ICD-10-CM

## 2019-07-07 MED ORDER — DEXMETHYLPHENIDATE HCL ER 40 MG PO CP24: 40.0000 mg | ORAL_CAPSULE | Freq: Every day | ORAL | 0 refills | Status: DC

## 2019-08-06 ENCOUNTER — Other Ambulatory Visit: Payer: Self-pay | Admitting: Physician Assistant

## 2019-08-06 DIAGNOSIS — F988 Other specified behavioral and emotional disorders with onset usually occurring in childhood and adolescence: Secondary | ICD-10-CM

## 2019-08-06 NOTE — Telephone Encounter (Signed)
Dexmethylphenidate last rx 07/07/19 #30 LOV: 05/18/19 CSC: 10/30/16 UDS: None

## 2019-08-07 MED ORDER — DEXMETHYLPHENIDATE HCL ER 40 MG PO CP24: 40.0000 mg | ORAL_CAPSULE | Freq: Every day | ORAL | 0 refills | Status: DC

## 2019-09-08 ENCOUNTER — Other Ambulatory Visit: Payer: Self-pay | Admitting: Physician Assistant

## 2019-09-08 DIAGNOSIS — F988 Other specified behavioral and emotional disorders with onset usually occurring in childhood and adolescence: Secondary | ICD-10-CM

## 2019-09-08 MED ORDER — DEXMETHYLPHENIDATE HCL ER 40 MG PO CP24: 40.0000 mg | ORAL_CAPSULE | Freq: Every day | ORAL | 0 refills | Status: DC

## 2019-09-08 NOTE — Telephone Encounter (Signed)
Dexmethylpenidate last rx 08/07/19 #30 LOV: 04/30/19  CSC: 10/30/16

## 2019-09-16 ENCOUNTER — Encounter: Payer: Self-pay | Admitting: Physician Assistant

## 2019-09-16 ENCOUNTER — Other Ambulatory Visit: Payer: Self-pay

## 2019-09-16 ENCOUNTER — Ambulatory Visit (INDEPENDENT_AMBULATORY_CARE_PROVIDER_SITE_OTHER): Payer: PRIVATE HEALTH INSURANCE | Admitting: Physician Assistant

## 2019-09-16 DIAGNOSIS — F988 Other specified behavioral and emotional disorders with onset usually occurring in childhood and adolescence: Secondary | ICD-10-CM

## 2019-09-16 MED ORDER — DEXMETHYLPHENIDATE HCL ER 40 MG PO CP24: 40.0000 mg | ORAL_CAPSULE | Freq: Every day | ORAL | 0 refills | Status: DC

## 2019-09-16 NOTE — Progress Notes (Signed)
   Virtual Visit via Video   I connected with patient on 09/16/19 at  9:00 AM EST by a video enabled telemedicine application and verified that I am speaking with the correct person using two identifiers.  Location patient: Home Location provider: [place], Office Persons participating in the virtual visit: Patient, Provider, CMA Wyonia Hough)  I discussed the limitations of evaluation and management by telemedicine and the availability of in person appointments. The patient expressed understanding and agreed to proceed.  Subjective:   HPI:   Patient presents via Doxy.Me for routine follow-up of ADD.  Patient currently on a regimen of dexmethylphenidate 40 mg daily.  Endorses taking as directed on workdays only.  Tolerating well.  Has previously been well controlled on this regimen and endorses continued control of symptoms.  Work is going well overall.  She is working on a move to [facility].  Notes mood is stable.  Sleeping well and eating well.  Denies new concerns today.  ROS:   See pertinent positives and negatives per HPI.  Patient Active Problem List   Diagnosis Date Noted  . Immunity status testing 01/26/2017  . Visit for preventive health examination 03/14/2016  . Acne vulgaris 05/25/2015  . ADD (attention deficit disorder) 03/12/2014    Social History   Tobacco Use  . Smoking status: Never Smoker  . Smokeless tobacco: Never Used  Substance Use Topics  . Alcohol use: No    Current Outpatient Medications:  .  Dexmethylphenidate HCl 40 MG CP24, Take 1 capsule (40 mg total) by mouth daily., Disp: 30 capsule, Rfl: 0 .  norethindrone-ethinyl estradiol 1/35 (ORTHO-NOVUM 1/35, 28,) tablet, Take 1 tablet by mouth daily., Disp: 1 Package, Rfl: 11  Allergies  Allergen Reactions  . Adderall [Amphetamine-Dextroamphetamine] Hives    Also with insomnia and weight loss  . Albuterol Sulfate     Seizures     Objective:   There were no vitals taken for  this visit.  Patient is well-developed, well-nourished in no acute distress.  Resting comfortably at home.  Head is normocephalic, atraumatic.  No labored breathing.  Speech is clear and coherent with logical content.  Patient is alert and oriented at baseline.   Assessment and Plan:   1. Attention deficit disorder (ADD) without hyperactivity Stable.  Continue current medication regimen.  Follow-up in 6 months if still in the state.  She is to look for a new PCP in [place] once moved to take her medications. - Dexmethylphenidate HCl 40 MG CP24; Take 1 capsule (40 mg total) by mouth daily.  Dispense: 30 capsule; Refill: 0    Piedad Climes, [place] 09/16/2019

## 2019-09-16 NOTE — Progress Notes (Signed)
I have discussed the procedure for the virtual visit with the patient who has given consent to proceed with assessment and treatment.  Patient unable to obtain vital signs.  Artavia Jeanlouis N Torin Modica, CMA      

## 2019-11-03 ENCOUNTER — Other Ambulatory Visit: Payer: Self-pay | Admitting: Physician Assistant

## 2019-11-03 DIAGNOSIS — F988 Other specified behavioral and emotional disorders with onset usually occurring in childhood and adolescence: Secondary | ICD-10-CM

## 2019-11-03 MED ORDER — DEXMETHYLPHENIDATE HCL ER 40 MG PO CP24
40.0000 mg | ORAL_CAPSULE | Freq: Every day | ORAL | 0 refills | Status: DC
Start: 2019-11-03 — End: 2019-12-04

## 2019-12-04 ENCOUNTER — Other Ambulatory Visit: Payer: Self-pay | Admitting: Physician Assistant

## 2019-12-04 DIAGNOSIS — F988 Other specified behavioral and emotional disorders with onset usually occurring in childhood and adolescence: Secondary | ICD-10-CM

## 2019-12-05 MED ORDER — DEXMETHYLPHENIDATE HCL ER 40 MG PO CP24: 40.0000 mg | ORAL_CAPSULE | Freq: Every day | ORAL | 0 refills | Status: DC

## 2019-12-05 NOTE — Telephone Encounter (Signed)
Dexmethylphenidate last rx 11/03/19 #30 LOV: 09/16/19 ADD

## 2020-01-02 ENCOUNTER — Other Ambulatory Visit: Payer: Self-pay | Admitting: Physician Assistant

## 2020-01-02 DIAGNOSIS — F988 Other specified behavioral and emotional disorders with onset usually occurring in childhood and adolescence: Secondary | ICD-10-CM

## 2020-01-02 MED ORDER — DEXMETHYLPHENIDATE HCL ER 40 MG PO CP24: 40.0000 mg | ORAL_CAPSULE | Freq: Every day | ORAL | 0 refills | Status: DC

## 2020-01-05 ENCOUNTER — Other Ambulatory Visit: Payer: Self-pay | Admitting: Family Medicine

## 2020-01-05 ENCOUNTER — Encounter: Payer: Self-pay | Admitting: General Practice

## 2020-01-05 ENCOUNTER — Other Ambulatory Visit: Payer: Self-pay

## 2020-01-05 DIAGNOSIS — F988 Other specified behavioral and emotional disorders with onset usually occurring in childhood and adolescence: Secondary | ICD-10-CM

## 2020-01-05 MED ORDER — DEXMETHYLPHENIDATE HCL ER 40 MG PO CP24: 40.0000 mg | ORAL_CAPSULE | Freq: Every day | ORAL | 0 refills | Status: DC

## 2020-01-05 NOTE — Telephone Encounter (Signed)
Can you verify what pharmacy pt needs? There was no answer when I tried calling.

## 2020-01-05 NOTE — Telephone Encounter (Signed)
Please advise 

## 2020-01-05 NOTE — Telephone Encounter (Signed)
Called patient's preferred pharmacy [facility]) they stated they should be listed under "[facility]". Spoke with Michele Mcalpine at [facility] to cancel the prescription that was sent there.

## 2020-01-05 NOTE — Addendum Note (Signed)
Addended by: Geannie Risen on: 01/05/2020 04:15 PM   Modules accepted: Orders

## 2020-01-05 NOTE — Telephone Encounter (Signed)
Just seeing this now as I was not in office today. So I see her ADD medication was resubmitted to the [facility] pharmacy that was first requested when she called. Will she be picking this up there or did the ADD medication also need to be sent to the [facility] pharmacy instead? Also since this is a controlled medication she needs updated CSC to reflect the new pharmacy she wants to use. We can take care of this at her next follow-up.

## 2020-01-05 NOTE — Telephone Encounter (Signed)
Unfortunately that doesn't come up either.  I will defer to PCP

## 2020-01-05 NOTE — Telephone Encounter (Signed)
Patient called back to verify pharmacy information. Pt needs her medication to go to the [facility] on [address]  The address [address], [place], [place] [address]   Phone # [phone]

## 2020-01-05 NOTE — Telephone Encounter (Signed)
Patient called in stating her prescription (dexmethylphenidate)  was approved and sent to [facility] pharmacy. Per her new insurance, prescriptions need to be sent to [facility] per patient. She called the [facility] and was informed that the prescription cannot be transferred and a new prescription would need to be sent in. Please advise.

## 2020-01-05 NOTE — Telephone Encounter (Signed)
Pt needs her medication to go to the [facility] pharmacy on [address]   [address]  [place], [place] [address]  Phone # [phone]

## 2020-01-05 NOTE — Telephone Encounter (Signed)
Mychart also sent to patient to advise.

## 2020-01-05 NOTE — Telephone Encounter (Signed)
Called the number the patient gave for her preferred pharmacy. Staff state that they are a coumadin clinic. They do not fill prescriptions. I informed the patient. She states that was one of the options her insurance company gave her. Patient gave yet another preferred pharmacy. Pharmacy information is updated in her chart.

## 2020-01-05 NOTE — Telephone Encounter (Signed)
Prescription was sent to new pharmacy.

## 2020-01-05 NOTE — Progress Notes (Signed)
Prescription sent to new pharmacy at pt's request

## 2020-01-05 NOTE — Telephone Encounter (Signed)
This pharmacy is not in our e-prescribing system that I can find.  Please call Pharmacy and ask how they are listed in Epic in order for refill to be sent.  Also, I sent the prescription to the [facility] as originally asked and this order needs to be cancelled as it is a controlled substance

## 2020-01-06 MED ORDER — DEXMETHYLPHENIDATE HCL ER 40 MG PO CP24: 40.0000 mg | ORAL_CAPSULE | Freq: Every day | ORAL | 0 refills | Status: DC

## 2020-01-06 NOTE — Addendum Note (Signed)
Addended by: Waldon Merl on: 01/06/2020 09:16 AM   Modules accepted: Orders

## 2020-01-06 NOTE — Telephone Encounter (Signed)
Left a vm message informing the patient her prescription was sent to her pharmacy.

## 2020-01-06 NOTE — Telephone Encounter (Signed)
Pharmacy was updated to reflect where she will be filling her prescriptions due to her new insurance. Patient states her ADD medication needs to be sent to the [facility]. Prescription to med center was cancelled.

## 2020-01-06 NOTE — Telephone Encounter (Signed)
Rx sent to new pharmacy.

## 2020-01-16 ENCOUNTER — Telehealth: Payer: Self-pay | Admitting: Physician Assistant

## 2020-01-16 DIAGNOSIS — Z3041 Encounter for surveillance of contraceptive pills: Secondary | ICD-10-CM

## 2020-01-16 MED ORDER — ORTHO-NOVUM 1/35 (28) 1-35 MG-MCG PO TABS
1.0000 | ORAL_TABLET | Freq: Every day | ORAL | 11 refills | Status: AC
Start: 2020-01-16 — End: ?

## 2020-01-16 NOTE — Telephone Encounter (Signed)
Rx sent to the preferred patient pharmacy  

## 2020-01-16 NOTE — Telephone Encounter (Signed)
..   LAST APPOINTMENT DATE: 09/16/19   NEXT APPOINTMENT DATE:@Visit  date not found  MEDICATION:norethindrone-ethinyl estradiol   PHARMACY:[facility]   **Let patient know to contact pharmacy at the end of the day to make sure medication is ready. **  ** Please notify patient to allow 48-72 hours to process**  **Encourage patient to contact the pharmacy for refills or they can request refills through [facility]**  CLINICAL FILLS OUT ALL BELOW:   LAST REFILL:  QTY:  REFILL DATE:    OTHER COMMENTS:    Okay for refill?  Please advise

## 2020-02-04 ENCOUNTER — Other Ambulatory Visit: Payer: Self-pay | Admitting: Physician Assistant

## 2020-02-04 DIAGNOSIS — F988 Other specified behavioral and emotional disorders with onset usually occurring in childhood and adolescence: Secondary | ICD-10-CM

## 2020-02-04 NOTE — Telephone Encounter (Signed)
Last OV 09/16/19 Dexmethylphenidate last filled 01/06/20 #30 with 0

## 2020-02-05 MED ORDER — DEXMETHYLPHENIDATE HCL ER 40 MG PO CP24: 40.0000 mg | ORAL_CAPSULE | Freq: Every day | ORAL | 0 refills | Status: DC

## 2020-03-04 ENCOUNTER — Other Ambulatory Visit: Payer: Self-pay | Admitting: Physician Assistant

## 2020-03-04 DIAGNOSIS — F988 Other specified behavioral and emotional disorders with onset usually occurring in childhood and adolescence: Secondary | ICD-10-CM

## 2020-03-04 NOTE — Telephone Encounter (Signed)
Patient is scheduled for follow up appointment on Friday 03/05/20 for refill

## 2020-03-05 ENCOUNTER — Telehealth (INDEPENDENT_AMBULATORY_CARE_PROVIDER_SITE_OTHER): Payer: Self-pay | Admitting: Physician Assistant

## 2020-03-05 ENCOUNTER — Encounter: Payer: Self-pay | Admitting: Physician Assistant

## 2020-03-05 ENCOUNTER — Other Ambulatory Visit: Payer: Self-pay

## 2020-03-05 VITALS — BP 116/69

## 2020-03-05 DIAGNOSIS — F988 Other specified behavioral and emotional disorders with onset usually occurring in childhood and adolescence: Secondary | ICD-10-CM

## 2020-03-05 MED ORDER — DEXMETHYLPHENIDATE HCL ER 40 MG PO CP24: 40.0000 mg | ORAL_CAPSULE | Freq: Every day | ORAL | 0 refills | Status: DC

## 2020-03-05 NOTE — Patient Instructions (Addendum)
Instructions sent to MyChart

## 2020-03-05 NOTE — Telephone Encounter (Signed)
Patient has an appt today for refill of medication. System would not let me refuse.

## 2020-03-05 NOTE — Progress Notes (Signed)
I have discussed the procedure for the virtual visit with the patient who has given consent to proceed with assessment and treatment.   Angelette Ganus S Sadey Yandell, CMA     

## 2020-03-05 NOTE — Progress Notes (Signed)
   Virtual Visit via Video   I connected with patient on 03/05/20 at  1:30 PM EDT by a video enabled telemedicine application and verified that I am speaking with the correct person using two identifiers.  Location patient: Home Location provider: [place] [facility], Office Persons participating in the virtual visit: Patient, Provider, CMA (Patina Moore)  I discussed the limitations of evaluation and management by telemedicine and the availability of in person appointments. The patient expressed understanding and agreed to proceed.  Subjective:   HPI:   Patient presents via [facility] today for follow-up of ADD. Patient on a longstanding regimen of Dexmethylphenidate 40 mg daily. Endorses continuing to take medication on work days as directed. Still tolerating well with continual good results. Denies any weight loss, anorexia, insomnia or racing heart with medication.   ROS:   See pertinent positives and negatives per HPI.  Patient Active Problem List   Diagnosis Date Noted  . Immunity status testing 01/26/2017  . Visit for preventive health examination 03/14/2016  . Acne vulgaris 05/25/2015  . ADD (attention deficit disorder) 03/12/2014    Social History   Tobacco Use  . Smoking status: Never Smoker  . Smokeless tobacco: Never Used  Substance Use Topics  . Alcohol use: No    Current Outpatient Medications:  .  Dexmethylphenidate HCl 40 MG CP24, Take 1 capsule (40 mg total) by mouth daily. No further refills without follow-up., Disp: 30 capsule, Rfl: 0 .  norethindrone-ethinyl estradiol 1/35 (ORTHO-NOVUM 1/35, 28,) tablet, Take 1 tablet by mouth daily., Disp: 1 Package, Rfl: 11  Allergies  Allergen Reactions  . Adderall [Amphetamine-Dextroamphetamine] Hives    Also with insomnia and weight loss  . Albuterol Sulfate     Seizures     Objective:   There were no vitals taken for this visit.  Patient is well-developed, well-nourished in no acute distress.  Resting  comfortably at home.  Head is normocephalic, atraumatic.  No labored breathing.  Speech is clear and coherent with logical content.  Patient is alert and oriented at baseline.   Assessment and Plan:   1. Attention deficit disorder (ADD) without hyperactivity Stable. Meds refilled. Continue current regimen. She is moving to [facility] in September and will be finding a new PCP there. She is aware that we are not able to send controlled medications to [place].     Piedad Climes, PA-C 03/05/2020

## 2020-04-05 ENCOUNTER — Other Ambulatory Visit: Payer: Self-pay | Admitting: Physician Assistant

## 2020-04-05 DIAGNOSIS — F988 Other specified behavioral and emotional disorders with onset usually occurring in childhood and adolescence: Secondary | ICD-10-CM

## 2020-04-05 NOTE — Telephone Encounter (Signed)
Dexmethylphenidate last rx 03/05/20 #30 LOV: 03/05/20 ADD CSC: 10/30/16

## 2020-04-06 MED ORDER — DEXMETHYLPHENIDATE HCL ER 40 MG PO CP24: 40.0000 mg | ORAL_CAPSULE | Freq: Every day | ORAL | 0 refills | Status: AC

## 2020-04-22 ENCOUNTER — Other Ambulatory Visit: Payer: Self-pay

## 2020-04-22 ENCOUNTER — Encounter (HOSPITAL_COMMUNITY): Payer: Self-pay

## 2020-04-22 ENCOUNTER — Emergency Department (HOSPITAL_COMMUNITY): Payer: Managed Care, Other (non HMO)

## 2020-04-22 ENCOUNTER — Emergency Department (HOSPITAL_COMMUNITY)
Admission: EM | Admit: 2020-04-22 | Discharge: 2020-04-22 | Disposition: A | Discharge status: Home / Self Care | Payer: Managed Care, Other (non HMO) | Attending: Emergency Medicine | Admitting: Emergency Medicine

## 2020-04-22 DIAGNOSIS — S60410A Abrasion of right index finger, initial encounter: Secondary | ICD-10-CM | POA: Diagnosis not present

## 2020-04-22 DIAGNOSIS — Y9289 Other specified places as the place of occurrence of the external cause: Secondary | ICD-10-CM | POA: Diagnosis not present

## 2020-04-22 DIAGNOSIS — W230XXA Caught, crushed, jammed, or pinched between moving objects, initial encounter: Secondary | ICD-10-CM | POA: Diagnosis not present

## 2020-04-22 DIAGNOSIS — Y939 Activity, unspecified: Secondary | ICD-10-CM | POA: Insufficient documentation

## 2020-04-22 DIAGNOSIS — Y990 Civilian activity done for income or pay: Secondary | ICD-10-CM | POA: Diagnosis not present

## 2020-04-22 DIAGNOSIS — Z79899 Other long term (current) drug therapy: Secondary | ICD-10-CM | POA: Diagnosis not present

## 2020-04-22 DIAGNOSIS — S65500A Unspecified injury of blood vessel of right index finger, initial encounter: Secondary | ICD-10-CM | POA: Diagnosis present

## 2020-04-22 DIAGNOSIS — S6991XA Unspecified injury of right wrist, hand and finger(s), initial encounter: Secondary | ICD-10-CM

## 2020-04-22 NOTE — Discharge Instructions (Signed)
Please keep your abrasion clean and dry. Please take Ibuprofen (Advil, motrin) and Tylenol (acetaminophen) to relieve your pain.  You may take up to 600 MG (3 pills) of normal strength ibuprofen every 8 hours as needed.  In between doses of ibuprofen you make take tylenol, up to 1,000 mg (two extra strength pills).  Do not take more than 3,000 mg tylenol in a 24 hour period.  Please check all medication labels as many medications such as pain and cold medications may contain tylenol.  Do not drink alcohol while taking these medications.  Do not take other NSAID'S while taking ibuprofen (such as aleve or naproxen).  Please take ibuprofen with food to decrease stomach upset.

## 2020-04-22 NOTE — ED Provider Notes (Signed)
[facility] EMERGENCY DEPARTMENT Provider Note   CSN: [id number] Arrival date & time: 04/22/20  5361     History Chief Complaint  Patient presents with  . Hand Injury    Jeanette Moreno is a 24 y.o. female with a past medical history of ADD who presents today for evaluation of a right index finger injury.  She is left-hand dominant.  She works in a [employer] and was using the x-ray machine when her right index finger got caught in the x-ray machine.  It was pinched for about a minute.  Her tetanus is up-to-date.  She denies any other injuries.  No numbness paresthesias.  HPI     Past Medical History:  Diagnosis Date  . ADD (attention deficit disorder)     Patient Active Problem List   Diagnosis Date Noted  . Immunity status testing 01/26/2017  . Visit for preventive health examination 03/14/2016  . Acne vulgaris 05/25/2015  . ADD (attention deficit disorder) 03/12/2014    History reviewed. No pertinent surgical history.   OB History   No obstetric history on file.     Family History  Problem Relation Age of Onset  . Cancer Maternal Grandmother 59       breast  . Hypertension Maternal Grandfather   . Diabetes Maternal Grandfather   . Cancer Paternal Grandfather        throat cancer  . Breast cancer Mother     Social History   Tobacco Use  . Smoking status: Never Smoker  . Smokeless tobacco: Never Used  Vaping Use  . Vaping Use: Never used  Substance Use Topics  . Alcohol use: No  . Drug use: No    Home Medications Prior to Admission medications   Medication Sig Start Date End Date Taking? Authorizing Provider  Dexmethylphenidate HCl 40 MG CP24 Take 1 capsule (40 mg total) by mouth daily. 04/06/20   Waldon Merl, PA-C  norethindrone-ethinyl estradiol 1/35 (ORTHO-NOVUM 1/35, 28,) tablet Take 1 tablet by mouth daily. 01/16/20   Waldon Merl, PA-C    Allergies    Adderall [amphetamine-dextroamphetamine] and Albuterol  sulfate  Review of Systems   Review of Systems  Constitutional: Negative for chills and fever.  Musculoskeletal:       Pain and edema in right index finger  Skin: Positive for wound.  All other systems reviewed and are negative.   Physical Exam Updated Vital Signs BP (!) 127/92 (BP Location: Right Arm)   Pulse 80   Temp 98.2 F (36.8 C) (Oral)   Resp 18   SpO2 100%   Physical Exam Vitals and nursing note reviewed.  Constitutional:      General: She is not in acute distress.    Appearance: She is not ill-appearing.  HENT:     Head: Normocephalic.  Cardiovascular:     Rate and Rhythm: Normal rate.     Comments: Capillary refill under 2 seconds to right index finger distally. Pulmonary:     Effort: Pulmonary effort is normal. No respiratory distress.  Musculoskeletal:     Comments: Moderate edema around the right index finger, mostly around the proximal and mid phalanges with associated ecchymosis.  No obvious deformity or crepitus.  Patient is able to fully flex and extend the finger including make a fist.  Remainder of right hand is nontender.  Skin:    Capillary Refill: Capillary refill takes less than 2 seconds.     Comments: No subungual hematoma on  right index finger.  There is a small, superficial abrasion visualized on the dorsum of the right index finger, subcentimeter.  No active bleeding, is not fully through the dermis.  Neurological:     Mental Status: She is alert.     Comments: Sensation intact to light touch to right distal index finger.  Psychiatric:        Mood and Affect: Mood normal.        Behavior: Behavior normal.     ED Results / Procedures / Treatments   Labs (all labs ordered are listed, but only abnormal results are displayed) Labs Reviewed - No data to display  EKG None  Radiology DG Hand Complete Right  Result Date: 04/22/2020 CLINICAL DATA:  Hand injury, caught in hands on machine. Indentation around PIP joint and along the index  finger. EXAM: RIGHT HAND - COMPLETE 3+ VIEW COMPARISON:  None FINDINGS: There is no evidence of fracture or dislocation. There is no evidence of arthropathy or other focal bone abnormality. Soft tissues are unremarkable. IMPRESSION: Negative evaluation of the RIGHT hand. Electronically Signed   By: Donzetta Kohut M.D.   On: 04/22/2020 10:10    Procedures Procedures (including critical care time)  Medications Ordered in ED Medications - No data to display  ED Course  I have reviewed the triage vital signs and the nursing notes.  Pertinent labs & imaging results that were available during my care of the patient were reviewed by me and considered in my medical decision making (see chart for details).    MDM Rules/Calculators/A&P                         Patient is a 24 year old woman who presents today for evaluation of a finger injury.  She was reportedly at work when her right index finger got stuck on a piece of x-ray machinery.  She is up-to-date on her tetanus.  Superficial abrasion.  X-ray obtained without fracture.  Moderate edema of the finger however she is neurovascularly intact.  Suspect contusion.  Pain is improved in the ER with elevation.  Discussed conservative care including RICE.  Return to work note given.  Return precautions were discussed with patient who states their understanding.  At the time of discharge patient denied any unaddressed complaints or concerns.  Patient is agreeable for discharge home.  Note: Portions of this report may have been transcribed using voice recognition software. Every effort was made to ensure accuracy; however, inadvertent computerized transcription errors may be present   Final Clinical Impression(s) / ED Diagnoses Final diagnoses:  Injury of finger of right hand, initial encounter    Rx / DC Orders ED Discharge Orders    None       Norman Clay 04/22/20 1436    Linwood Dibbles, MD 04/27/20 1515

## 2020-04-22 NOTE — ED Triage Notes (Signed)
Pt presents with Right hand pain due to getting her Right index finger caught in the xr machine at work.
